# Patient Record
Sex: Female | Born: 1960
Health system: Southern US, Community
[De-identification: ages and names within clinical notes are randomized; demographics above are authoritative.]

## PROBLEM LIST (undated history)

## (undated) DIAGNOSIS — I471 Supraventricular tachycardia, unspecified: Secondary | ICD-10-CM

## (undated) DIAGNOSIS — F329 Major depressive disorder, single episode, unspecified: Secondary | ICD-10-CM

## (undated) DIAGNOSIS — R002 Palpitations: Secondary | ICD-10-CM

## (undated) DIAGNOSIS — F32A Depression, unspecified: Secondary | ICD-10-CM

## (undated) DIAGNOSIS — E782 Mixed hyperlipidemia: Secondary | ICD-10-CM

## (undated) DIAGNOSIS — R079 Chest pain, unspecified: Secondary | ICD-10-CM

## (undated) DIAGNOSIS — I499 Cardiac arrhythmia, unspecified: Secondary | ICD-10-CM

## (undated) DIAGNOSIS — B019 Varicella without complication: Secondary | ICD-10-CM

## (undated) DIAGNOSIS — R400 Somnolence: Secondary | ICD-10-CM

## (undated) DIAGNOSIS — G4730 Sleep apnea, unspecified: Secondary | ICD-10-CM

## (undated) DIAGNOSIS — E785 Hyperlipidemia, unspecified: Secondary | ICD-10-CM

## (undated) DIAGNOSIS — M722 Plantar fascial fibromatosis: Secondary | ICD-10-CM

## (undated) DIAGNOSIS — N83209 Unspecified ovarian cyst, unspecified side: Secondary | ICD-10-CM

## (undated) DIAGNOSIS — M1990 Unspecified osteoarthritis, unspecified site: Secondary | ICD-10-CM

## (undated) DIAGNOSIS — I4891 Unspecified atrial fibrillation: Secondary | ICD-10-CM

## (undated) HISTORY — DX: Unspecified ovarian cyst, unspecified side: N83.209

## (undated) HISTORY — DX: Varicella without complication: B01.9

## (undated) HISTORY — DX: Hyperlipidemia, unspecified: E78.5

## (undated) HISTORY — DX: Plantar fascial fibromatosis: M72.2

## (undated) HISTORY — DX: Somnolence: R40.0

## (undated) HISTORY — PX: TONSILLECTOMY: SUR1361

## (undated) HISTORY — DX: Chest pain, unspecified: R07.9

## (undated) HISTORY — DX: Cardiac arrhythmia, unspecified: I49.9

## (undated) HISTORY — DX: Palpitations: R00.2

## (undated) HISTORY — DX: Supraventricular tachycardia: I47.1

## (undated) HISTORY — DX: Supraventricular tachycardia, unspecified: I47.10

## (undated) HISTORY — PX: REPLACEMENT TOTAL KNEE BILATERAL: SUR1225

## (undated) HISTORY — DX: Mixed hyperlipidemia: E78.2

---

## 1978-03-03 HISTORY — PX: OVARIAN CYST SURGERY: SHX726

## 2005-03-03 HISTORY — PX: KNEE ARTHROSCOPY: SUR90

## 2009-03-29 ENCOUNTER — Ambulatory Visit: Payer: Self-pay | Admitting: Obstetrics and Gynecology

## 2009-05-14 ENCOUNTER — Ambulatory Visit: Payer: Self-pay | Admitting: Gastroenterology

## 2009-06-12 ENCOUNTER — Other Ambulatory Visit: Payer: Self-pay | Admitting: Internal Medicine

## 2009-10-16 ENCOUNTER — Other Ambulatory Visit: Payer: Self-pay | Admitting: Internal Medicine

## 2010-01-21 ENCOUNTER — Other Ambulatory Visit: Payer: Self-pay | Admitting: Internal Medicine

## 2010-04-16 ENCOUNTER — Ambulatory Visit: Payer: Self-pay | Admitting: Obstetrics and Gynecology

## 2010-04-19 ENCOUNTER — Ambulatory Visit: Payer: Self-pay | Admitting: Obstetrics and Gynecology

## 2010-04-23 ENCOUNTER — Ambulatory Visit: Payer: Self-pay | Admitting: General Practice

## 2010-05-06 ENCOUNTER — Inpatient Hospital Stay: Payer: Self-pay | Admitting: General Practice

## 2010-05-06 HISTORY — PX: BILATERAL KNEE ARTHROSCOPY: SUR91

## 2010-05-11 ENCOUNTER — Ambulatory Visit: Payer: Self-pay | Admitting: Anesthesiology

## 2010-05-29 ENCOUNTER — Encounter: Payer: Self-pay | Admitting: General Practice

## 2010-06-02 ENCOUNTER — Encounter: Payer: Self-pay | Admitting: General Practice

## 2010-07-02 ENCOUNTER — Encounter: Payer: Self-pay | Admitting: General Practice

## 2010-07-10 ENCOUNTER — Emergency Department: Payer: Self-pay | Admitting: General Practice

## 2010-07-11 ENCOUNTER — Emergency Department: Payer: Self-pay | Admitting: Emergency Medicine

## 2010-08-02 ENCOUNTER — Encounter: Payer: Self-pay | Admitting: General Practice

## 2010-08-21 ENCOUNTER — Other Ambulatory Visit: Payer: Self-pay | Admitting: Internal Medicine

## 2010-09-01 ENCOUNTER — Encounter: Payer: Self-pay | Admitting: General Practice

## 2010-10-02 ENCOUNTER — Encounter: Payer: Self-pay | Admitting: General Practice

## 2010-10-29 ENCOUNTER — Ambulatory Visit: Payer: Self-pay | Admitting: Obstetrics and Gynecology

## 2011-02-03 ENCOUNTER — Emergency Department: Payer: Self-pay | Admitting: Emergency Medicine

## 2011-03-21 ENCOUNTER — Other Ambulatory Visit: Payer: Self-pay | Admitting: Internal Medicine

## 2011-03-21 LAB — LIPID PANEL
HDL Cholesterol: 60 mg/dL (ref 40–60)
Ldl Cholesterol, Calc: 163 mg/dL — ABNORMAL HIGH (ref 0–100)

## 2011-05-15 ENCOUNTER — Ambulatory Visit: Payer: Self-pay

## 2011-06-02 ENCOUNTER — Ambulatory Visit: Payer: Self-pay | Admitting: Anesthesiology

## 2011-06-06 ENCOUNTER — Ambulatory Visit: Payer: Self-pay | Admitting: General Practice

## 2011-06-06 HISTORY — PX: TRIGGER FINGER RELEASE: SHX641

## 2011-06-27 ENCOUNTER — Other Ambulatory Visit: Payer: Self-pay | Admitting: Internal Medicine

## 2011-06-27 LAB — COMPREHENSIVE METABOLIC PANEL
Albumin: 3.8 g/dL (ref 3.4–5.0)
Anion Gap: 9 (ref 7–16)
Bilirubin,Total: 0.5 mg/dL (ref 0.2–1.0)
Co2: 25 mmol/L (ref 21–32)
Creatinine: 0.7 mg/dL (ref 0.60–1.30)
EGFR (Non-African Amer.): >60
Glucose: 103 mg/dL — ABNORMAL HIGH (ref 65–99)
Sodium: 142 mmol/L (ref 136–145)
Total Protein: 7.1 g/dL (ref 6.4–8.2)

## 2011-06-27 LAB — LIPID PANEL
Cholesterol: 170 mg/dL (ref 0–200)
Triglycerides: 74 mg/dL (ref 0–200)
VLDL Cholesterol, Calc: 15 mg/dL (ref 5–40)

## 2011-06-27 LAB — HEMOGLOBIN A1C: Hemoglobin A1C: 5.8 % (ref 4.2–6.3)

## 2011-07-02 ENCOUNTER — Ambulatory Visit: Payer: Self-pay | Admitting: Internal Medicine

## 2011-12-26 ENCOUNTER — Ambulatory Visit: Payer: Self-pay | Admitting: Family Medicine

## 2012-01-20 ENCOUNTER — Other Ambulatory Visit: Payer: Self-pay | Admitting: Internal Medicine

## 2012-01-20 LAB — COMPREHENSIVE METABOLIC PANEL
Albumin: 3.6 g/dL (ref 3.4–5.0)
Alkaline Phosphatase: 80 U/L (ref 50–136)
Anion Gap: 2 — ABNORMAL LOW (ref 7–16)
BUN: 18 mg/dL (ref 7–18)
Bilirubin,Total: 0.5 mg/dL (ref 0.2–1.0)
Calcium, Total: 9 mg/dL (ref 8.5–10.1)
Creatinine: 0.77 mg/dL (ref 0.60–1.30)
Osmolality: 281 (ref 275–301)
Potassium: 3.9 mmol/L (ref 3.5–5.1)
SGOT(AST): 24 U/L (ref 15–37)
SGPT (ALT): 27 U/L (ref 12–78)
Total Protein: 6.9 g/dL (ref 6.4–8.2)

## 2012-01-20 LAB — URINALYSIS, COMPLETE
Bacteria: NONE SEEN
Bilirubin,UR: NEGATIVE
Glucose,UR: NEGATIVE mg/dL (ref 0–75)
Leukocyte Esterase: NEGATIVE
Nitrite: NEGATIVE
Protein: NEGATIVE
RBC,UR: 2 /HPF (ref 0–5)
Squamous Epithelial: <1
WBC UR: 1 /HPF (ref 0–5)

## 2012-01-20 LAB — LIPID PANEL
HDL Cholesterol: 66 mg/dL — ABNORMAL HIGH (ref 40–60)
Ldl Cholesterol, Calc: 90 mg/dL (ref 0–100)
Triglycerides: 64 mg/dL (ref 0–200)
VLDL Cholesterol, Calc: 13 mg/dL (ref 5–40)

## 2012-01-20 LAB — CBC WITH DIFFERENTIAL/PLATELET
Basophil #: 0 10*3/uL (ref 0.0–0.1)
Eosinophil #: 0.1 10*3/uL (ref 0.0–0.7)
HCT: 37.8 % (ref 35.0–47.0)
Lymphocyte #: 1.5 10*3/uL (ref 1.0–3.6)
Lymphocyte %: 40 %
MCHC: 33.6 g/dL (ref 32.0–36.0)
Monocyte #: 0.6 x10 3/mm (ref 0.2–0.9)
Neutrophil #: 1.5 10*3/uL (ref 1.4–6.5)
Neutrophil %: 41.2 %
Platelet: 205 10*3/uL (ref 150–440)
RBC: 4 10*6/uL (ref 3.80–5.20)
RDW: 13.3 % (ref 11.5–14.5)
WBC: 3.7 10*3/uL (ref 3.6–11.0)

## 2012-01-20 LAB — HEMOGLOBIN A1C: Hemoglobin A1C: 5.7 % (ref 4.2–6.3)

## 2012-06-16 ENCOUNTER — Ambulatory Visit: Payer: Self-pay | Admitting: Obstetrics and Gynecology

## 2012-08-17 ENCOUNTER — Ambulatory Visit: Payer: Self-pay | Admitting: Internal Medicine

## 2012-08-17 LAB — LIPID PANEL
Cholesterol: 173 mg/dL (ref 0–200)
HDL Cholesterol: 61 mg/dL — ABNORMAL HIGH (ref 40–60)
Triglycerides: 83 mg/dL (ref 0–200)
VLDL Cholesterol, Calc: 17 mg/dL (ref 5–40)

## 2012-08-17 LAB — CBC WITH DIFFERENTIAL/PLATELET
Basophil #: 0.1 10*3/uL (ref 0.0–0.1)
Basophil %: 1.2 %
Eosinophil #: 0.2 10*3/uL (ref 0.0–0.7)
Eosinophil %: 3.5 %
MCH: 32 pg (ref 26.0–34.0)
MCV: 94 fL (ref 80–100)
Monocyte #: 0.8 x10 3/mm (ref 0.2–0.9)
Neutrophil %: 49.3 %
RBC: 4.07 10*6/uL (ref 3.80–5.20)
RDW: 12.9 % (ref 11.5–14.5)
WBC: 4.6 10*3/uL (ref 3.6–11.0)

## 2012-08-17 LAB — COMPREHENSIVE METABOLIC PANEL
Calcium, Total: 9.3 mg/dL (ref 8.5–10.1)
Co2: 27 mmol/L (ref 21–32)
EGFR (African American): >60
EGFR (Non-African Amer.): >60
Osmolality: 280 (ref 275–301)
SGPT (ALT): 32 U/L (ref 12–78)
Sodium: 140 mmol/L (ref 136–145)
Total Protein: 6.8 g/dL (ref 6.4–8.2)

## 2012-08-17 LAB — TSH: Thyroid Stimulating Horm: 2.43 u[IU]/mL

## 2012-08-26 ENCOUNTER — Ambulatory Visit: Payer: Self-pay | Admitting: Internal Medicine

## 2012-08-31 ENCOUNTER — Ambulatory Visit: Payer: Self-pay | Admitting: Internal Medicine

## 2013-09-23 ENCOUNTER — Ambulatory Visit: Payer: Self-pay | Admitting: Obstetrics and Gynecology

## 2013-11-28 ENCOUNTER — Other Ambulatory Visit: Payer: Self-pay | Admitting: Internal Medicine

## 2013-11-28 LAB — URINALYSIS, COMPLETE
BACTERIA: NONE SEEN
BILIRUBIN, UR: NEGATIVE
Glucose,UR: NEGATIVE mg/dL (ref 0–75)
Ketone: NEGATIVE
LEUKOCYTE ESTERASE: NEGATIVE
NITRITE: NEGATIVE
Ph: 5 (ref 4.5–8.0)
Protein: NEGATIVE
Specific Gravity: 1.023 (ref 1.003–1.030)
WBC UR: <1 /HPF (ref 0–5)

## 2013-11-28 LAB — COMPREHENSIVE METABOLIC PANEL
ALBUMIN: 3.7 g/dL (ref 3.4–5.0)
Alkaline Phosphatase: 86 U/L
Anion Gap: 6 — ABNORMAL LOW (ref 7–16)
BILIRUBIN TOTAL: 0.4 mg/dL (ref 0.2–1.0)
BUN: 23 mg/dL — ABNORMAL HIGH (ref 7–18)
CHLORIDE: 105 mmol/L (ref 98–107)
Calcium, Total: 8.6 mg/dL (ref 8.5–10.1)
Co2: 28 mmol/L (ref 21–32)
Creatinine: 0.84 mg/dL (ref 0.60–1.30)
EGFR (African American): >60
EGFR (Non-African Amer.): >60
GLUCOSE: 104 mg/dL — AB (ref 65–99)
Osmolality: 282 (ref 275–301)
POTASSIUM: 4 mmol/L (ref 3.5–5.1)
SGOT(AST): 26 U/L (ref 15–37)
SGPT (ALT): 37 U/L
Sodium: 139 mmol/L (ref 136–145)
Total Protein: 7 g/dL (ref 6.4–8.2)

## 2013-11-28 LAB — MAGNESIUM: MAGNESIUM: 1.6 mg/dL — AB

## 2013-11-28 LAB — CBC WITH DIFFERENTIAL/PLATELET
Basophil #: 0 10*3/uL (ref 0.0–0.1)
Basophil %: 0.7 %
EOS ABS: 0.2 10*3/uL (ref 0.0–0.7)
EOS PCT: 3.1 %
HCT: 41 % (ref 35.0–47.0)
HGB: 13.1 g/dL (ref 12.0–16.0)
Lymphocyte #: 2.3 10*3/uL (ref 1.0–3.6)
Lymphocyte %: 34.1 %
MCH: 30.9 pg (ref 26.0–34.0)
MCHC: 31.9 g/dL — AB (ref 32.0–36.0)
MCV: 97 fL (ref 80–100)
MONO ABS: 0.9 x10 3/mm (ref 0.2–0.9)
Monocyte %: 13.2 %
NEUTROS ABS: 3.2 10*3/uL (ref 1.4–6.5)
NEUTROS PCT: 48.9 %
Platelet: 221 10*3/uL (ref 150–440)
RBC: 4.22 10*6/uL (ref 3.80–5.20)
RDW: 12.9 % (ref 11.5–14.5)
WBC: 6.6 10*3/uL (ref 3.6–11.0)

## 2013-11-28 LAB — LIPID PANEL
CHOLESTEROL: 196 mg/dL (ref 0–200)
HDL: 57 mg/dL (ref 40–60)
Ldl Cholesterol, Calc: 97 mg/dL (ref 0–100)
TRIGLYCERIDES: 208 mg/dL — AB (ref 0–200)
VLDL Cholesterol, Calc: 42 mg/dL — ABNORMAL HIGH (ref 5–40)

## 2013-11-28 LAB — TSH: Thyroid Stimulating Horm: 3.39 u[IU]/mL

## 2014-01-18 ENCOUNTER — Telehealth: Payer: Self-pay | Admitting: Internal Medicine

## 2014-01-18 NOTE — Telephone Encounter (Signed)
Letterhead request was faxed to Dr. Ubaldo Glassing at [facility] 11.18.15 at 8:08 to obtain records for appointment with Dr Taylor:djc Received 16 pages: given to Claiborne Billings L for Dr. Lovena Le

## 2014-01-19 ENCOUNTER — Ambulatory Visit (INDEPENDENT_AMBULATORY_CARE_PROVIDER_SITE_OTHER): Payer: 59 | Admitting: Internal Medicine

## 2014-01-19 ENCOUNTER — Encounter: Payer: Self-pay | Admitting: Internal Medicine

## 2014-01-19 VITALS — BP 140/76 | HR 55 | Ht 66.0 in | Wt 236.2 lb

## 2014-01-19 DIAGNOSIS — I480 Paroxysmal atrial fibrillation: Secondary | ICD-10-CM

## 2014-01-19 DIAGNOSIS — R002 Palpitations: Secondary | ICD-10-CM

## 2014-01-19 DIAGNOSIS — I4891 Unspecified atrial fibrillation: Secondary | ICD-10-CM | POA: Insufficient documentation

## 2014-01-19 DIAGNOSIS — E785 Hyperlipidemia, unspecified: Secondary | ICD-10-CM

## 2014-01-19 DIAGNOSIS — G4710 Hypersomnia, unspecified: Secondary | ICD-10-CM

## 2014-01-19 DIAGNOSIS — R400 Somnolence: Secondary | ICD-10-CM | POA: Insufficient documentation

## 2014-01-19 MED ORDER — ASPIRIN EC 81 MG PO TBEC: 81.0000 mg | DELAYED_RELEASE_TABLET | Freq: Every day | ORAL | Status: DC

## 2014-01-19 MED ORDER — FLECAINIDE ACETATE 100 MG PO TABS: 50.0000 mg | ORAL_TABLET | Freq: Two times a day (BID) | ORAL | Status: DC

## 2014-01-19 NOTE — Assessment & Plan Note (Signed)
While the patient may have had SVT in the past, she clearly has atrial fibrillation with a rapid ventricular response now, despite therapy with beta blockers. We have discussed the treatment options in detail. First, her stroke risk is low, and I recommended that she only take aspirin. Secondly is symptom control. She is quite symptomatic in atrial fibrillation. I have recommended that the patient be initiated on flecainide 50 mg twice daily. Up titration of her medications will be made based on how she responds to her initial low dose. Her left atrial dimension is normal, or nearly so.

## 2014-01-19 NOTE — Assessment & Plan Note (Signed)
The patient states that she has been evaluated, and does not have sleep apnea.

## 2014-01-19 NOTE — Assessment & Plan Note (Signed)
She will continue statin therapy with Crestor. She is encouraged to lose weight.

## 2014-01-19 NOTE — Progress Notes (Signed)
HPI [name] is referred today by Dr. Ubaldo Glassing for evaluation of tachycardia palpitations and documented atrial fibrillation. She is a very pleasant 53 year old woman who works at [employer]. The patient has had palpitations for many years, initially during pregnancy. At that time and subsequently, her episodes would start and stop suddenly, could be terminated with Valsalva maneuvers, and typically last only 10 or 15 minutes. Approximately 6 months ago, she began to experience episodes which would last for many hours, associated with shortness of breath and chest pressure, and she had documented episodes of tachycardia on a rhythm strip which I have been able to review. These episodes demonstrate atrial fibrillation with a rapid ventricular response. The patient has had multiple evaluations including a stress test which was negative and a 2-D echo which demonstrated normal left ventricular systolic function. Review of her echo demonstrates a normal sized left atrium. In atrial fibrillation, her ventricular rates are over 150 bpm. These episodes typically last for up to 4 hours, and/or occurring 2 or 3 times a month. Allergies  Allergen Reactions  . Ivp Dye [Iodinated Diagnostic Agents]     ALSO VICRYL CAUSES OPEN SORES/BLISTERS  . Pneumococcal Vac Polyvalent Other (See Comments)    Fever, edema     Current Outpatient Prescriptions  Medication Sig Dispense Refill  . Cholecalciferol (VITAMIN D3) 2000 UNITS TABS Take 1 tablet by mouth everyday except take 2 tablets by mouth on Sunday    . clotrimazole-betamethasone (LOTRISONE) cream APPLY A SMALL AMOUNT TO AFFECTED AREA 2 TIMES A DAY AS DIRECTED    . magnesium oxide (MAG-OX) 400 MG tablet Take 400 mg by mouth daily.     . metoprolol succinate (TOPROL-XL) 50 MG 24 hr tablet TAKE ONE TABLET BY MOUTH IN THE MORNING    . metoprolol tartrate (LOPRESSOR) 25 MG tablet Take 1 tablet by mouth daily as needed (arrythmias).     .  rosuvastatin (CRESTOR) 10 MG tablet TAKE ONE TABLET BY MOUTH IN THE MORNING    . venlafaxine XR (EFFEXOR-XR) 150 MG 24 hr capsule Take 1 capsule by mouth every morning.  3  . aspirin EC 81 MG tablet Take 1 tablet (81 mg total) by mouth daily. 90 tablet 3  . flecainide (TAMBOCOR) 100 MG tablet Take 0.5 tablets (50 mg total) by mouth 2 (two) times daily. 90 tablet 3   No current facility-administered medications for this visit.     Past Medical History  Diagnosis Date  . PSVT (paroxysmal supraventricular tachycardia)   . Mixed hyperlipidemia   . Daytime somnolence   . Palpitations   . Arrhythmia   . Chickenpox   . Hyperlipemia   . Ovarian cyst   . Plantar fasciitis of left foot   . Chest pain     NM Myocardial Scan 08/31/12     ROS:   All systems reviewed and negative except as noted in the HPI.   Past Surgical History  Procedure Laterality Date  . Knee arthroscopy Right 2007  . Bilateral knee arthroscopy  05/06/10  . Trigger finger release Right 06/06/11    Right ring finger  . Ovarian cyst surgery  1980     Family History  Problem Relation Age of Onset  . Hypertension Mother   . Diabetes type II Mother   . Asthma Mother   . Mental illness Mother   . Heart attack Father   . Hypertension Sister   . Diabetes type II Sister   .  Diabetes type II Other   . Cancer Other   . CAD      Fam Hx of CAD     History   Social History  . Marital Status: Married    Spouse Name: N/A    Number of Children: N/A  . Years of Education: N/A   Occupational History  . Not on file.   Social History Main Topics  . Smoking status: Former Smoker    Types: Cigarettes    Quit date: 03/04/1979  . Smokeless tobacco: Never Used  . Alcohol Use: No  . Drug Use: Not on file  . Sexual Activity: Not on file   Other Topics Concern  . Not on file   Social History Narrative     BP 140/76 mmHg  Pulse 55  Ht 5\' 6"  (1.676 m)  Wt 236 lb 3.2 oz (107.14 kg)  BMI 38.14 kg/m2  Physical  Exam:  Well appearing obese, middle-aged woman, NAD HEENT: Unremarkable Neck:  7 cm JVD, no thyromegally Back:  No CVA tenderness Lungs:  Clear with no wheezes, rales, or rhonchi. HEART:  Regular rate rhythm, no murmurs, no rubs, no clicks Abd:  soft, positive bowel sounds, no organomegally, no rebound, no guarding Ext:  2 plus pulses, no edema, no cyanosis, no clubbing Skin:  No rashes no nodules Neuro:  CN II through XII intact, motor grossly intact  EKG - sinus bradycardia  Telemetry tracings - atrial fibrillation with a rapid ventricular response  Assess/Plan:

## 2014-01-19 NOTE — Patient Instructions (Signed)
Your physician has recommended you make the following change in your medication:  1) Start Flecainide 100mg  ----take 1/2 tablet twice daily   Call Janan Halter, RN in 3 weeks [phone]

## 2014-02-22 ENCOUNTER — Ambulatory Visit: Payer: Self-pay | Admitting: Internal Medicine

## 2014-04-19 ENCOUNTER — Ambulatory Visit (INDEPENDENT_AMBULATORY_CARE_PROVIDER_SITE_OTHER): Payer: 59 | Admitting: Internal Medicine

## 2014-04-19 ENCOUNTER — Encounter: Payer: Self-pay | Admitting: Internal Medicine

## 2014-04-19 VITALS — BP 120/84 | HR 91 | Ht 66.0 in | Wt 239.0 lb

## 2014-04-19 DIAGNOSIS — I480 Paroxysmal atrial fibrillation: Secondary | ICD-10-CM

## 2014-04-19 DIAGNOSIS — R400 Somnolence: Secondary | ICD-10-CM

## 2014-04-19 DIAGNOSIS — G4710 Hypersomnia, unspecified: Secondary | ICD-10-CM

## 2014-04-19 DIAGNOSIS — E785 Hyperlipidemia, unspecified: Secondary | ICD-10-CM

## 2014-04-19 NOTE — Assessment & Plan Note (Signed)
Her body habitus would suggest that she has sleep apnea. However she has undergone sleep study and tells me that she does not have evidence of sleep apnea. I've encouraged the patient to lose weight.

## 2014-04-19 NOTE — Patient Instructions (Signed)
Your physician recommends that you continue on your current medications as directed. Please refer to the Current Medication list given to you today.  Your physician wants you to follow-up in: 6 months with Dr. Taylor.  You will receive a reminder letter in the mail two months in advance. If you don't receive a letter, please call our office to schedule the follow-up appointment.  

## 2014-04-19 NOTE — Assessment & Plan Note (Signed)
We have discussed the treatment options in detail. I've encouraged the patient not to miss additional doses of flecainide. We discussed the possibility of up titration of her flecainide. She will undergo watchful waiting. If she has increasingly frequent episodes of atrial fibrillation, she can take a second or third dose of flecainide up to a total of 100 mg twice daily. I'll see her back in 6 months.

## 2014-04-19 NOTE — Progress Notes (Signed)
HPI Brianna French returns today for follow-up. She is a very pleasant 54 year old woman with a history of palpitations and paroxysmal atrial arrhythmias. She has had up to 4 hours of atrial fibrillation in the past. When I saw the patient several months ago, we recommended initiation of low-dose flecainide. Her symptoms are much improved. She admits taking her flecainide every morning, but does occasionally miss her evening dose. Despite this, her symptoms have been very well controlled with only a few breakthrough episodes. She denies syncope or near-syncope. No peripheral edema. She does note bizarre dreams and nightmares. Allergies  Allergen Reactions  . Ivp Dye [Iodinated Diagnostic Agents]     ALSO VICRYL CAUSES OPEN SORES/BLISTERS  . Other Other (See Comments)    Vicryl suture causes sores/blisters  . Pneumococcal Vac Polyvalent Other (See Comments)    Fever, edema     Current Outpatient Prescriptions  Medication Sig Dispense Refill  . aspirin EC 81 MG tablet Take 1 tablet (81 mg total) by mouth daily. 90 tablet 3  . Cholecalciferol (VITAMIN D3) 2000 UNITS TABS Take 1 tablet by mouth everyday except take 2 tablets by mouth on Sunday    . clotrimazole-betamethasone (LOTRISONE) cream APPLY A SMALL AMOUNT TO AFFECTED AREA 2 TIMES A DAY AS DIRECTED    . flecainide (TAMBOCOR) 100 MG tablet Take 0.5 tablets (50 mg total) by mouth 2 (two) times daily. 90 tablet 3  . magnesium oxide (MAG-OX) 400 MG tablet Take 400 mg by mouth daily.     . rosuvastatin (CRESTOR) 10 MG tablet TAKE ONE TABLET BY MOUTH IN THE MORNING    . venlafaxine XR (EFFEXOR-XR) 150 MG 24 hr capsule Take 1 capsule by mouth every morning.  3   No current facility-administered medications for this visit.     Past Medical History  Diagnosis Date  . PSVT (paroxysmal supraventricular tachycardia)   . Mixed hyperlipidemia   . Daytime somnolence   . Palpitations   . Arrhythmia   . Chickenpox   . Hyperlipemia   .  Ovarian cyst   . Plantar fasciitis of left foot   . Chest pain     NM Myocardial Scan 08/31/12     ROS:   All systems reviewed and negative except as noted in the HPI.   Past Surgical History  Procedure Laterality Date  . Knee arthroscopy Right 2007  . Bilateral knee arthroscopy  05/06/10  . Trigger finger release Right 06/06/11    Right ring finger  . Ovarian cyst surgery  1980     Family History  Problem Relation Age of Onset  . Hypertension Mother   . Diabetes type II Mother   . Asthma Mother   . Mental illness Mother   . Heart attack Father   . Hypertension Sister   . Diabetes type II Sister   . Diabetes type II Other   . Cancer Other   . CAD      Fam Hx of CAD     History   Social History  . Marital Status: Married    Spouse Name: N/A  . Number of Children: N/A  . Years of Education: N/A   Occupational History  . Not on file.   Social History Main Topics  . Smoking status: Former Smoker    Types: Cigarettes    Quit date: 03/04/1979  . Smokeless tobacco: Never Used  . Alcohol Use: No  . Drug Use: Not on file  . Sexual Activity:  Not on file   Other Topics Concern  . Not on file   Social History Narrative     BP 120/84 mmHg  Pulse 91  Ht 5\' 6"  (1.676 m)  Wt 239 lb (108.41 kg)  BMI 38.59 kg/m2  Physical Exam:  Well appearing obese, middle-aged woman, NAD HEENT: Unremarkable Neck:  No JVD, no thyromegally Lymphatics:  No adenopathy Back:  No CVA tenderness Lungs:  Clear with no wheezes, rales, or rhonchi. HEART:  Regular rate rhythm, no murmurs, no rubs, no clicks Abd:  soft, positive bowel sounds, no organomegally, no rebound, no guarding Ext:  2 plus pulses, no edema, no cyanosis, no clubbing Skin:  No rashes no nodules Neuro:  CN II through XII intact, motor grossly intact  EKG Normal sinus rhythm.  Assess/Plan:

## 2014-04-19 NOTE — Assessment & Plan Note (Signed)
She has been trying to lose weight. I've encouraged the patient to consider Weight Watchers.

## 2014-06-25 NOTE — Op Note (Signed)
PATIENT NAME:  Brianna French, RAZAVI MR#:  [id number] DATE OF BIRTH:  08/10/1960  DATE OF PROCEDURE:  06/06/2011  PREOPERATIVE DIAGNOSES:  1. Stenosing tenosynovitis of the right ring finger.  2. Tenosynovitis involving the left thumb.   POSTOPERATIVE DIAGNOSES: 1. Stenosing tenosynovitis of the right ring finger.  2. Tenosynovitis involving the left thumb.   PROCEDURES PERFORMED:  1. Right ring trigger finger release.  2. Corticosteroid injection to the base of the left thumb.   SURGEON: Laurice Record. Hooten, MD  ANESTHESIA: General.   ESTIMATED BLOOD LOSS: Minimal.   TOURNIQUET TIME: 12 minutes.   DRAINS: None.   ESTIMATED BLOOD LOSS: Minimal.   INDICATIONS FOR SURGERY: The patient is a 54 year old female who has been seen for complaints of triggering and locking of the right ring finger with associated tenderness in the area of the A1 pulley. She has not seen any significant improvement despite conservative nonsurgical intervention. The patient also complained of pain at the base of the left thumb which she attributed to overuse while protecting the contralateral hand. After discussion of the risks and benefits of surgical intervention, the patient expressed her understanding of the risks and benefits and agreed with plans for surgical intervention.   PROCEDURE IN DETAIL: The patient was brought into the operating room and, after adequate general anesthesia was achieved, a tourniquet was placed on the patient's upper right arm. The patient's right hand and arm were cleaned and prepped with alcohol and DuraPrep and draped in the usual sterile fashion. A time out was performed as per usual protocol. The right upper extremity was exsanguinated using an Esmarch, and the tourniquet was inflated to 250 mmHg. Loupe magnification was used throughout the procedure. A transverse incision was made proximal to the A-1 pulley of the right ring finger. Blunt dissection was carried down to the tendon  sheath and the leading edge of the A1 pulley was identified. The A-1 pulley was incised using tenotomy scissors. Complete release of the A1 pulley was appreciated. A moderate amount of tenosynovial fluid was noted. The wound was irrigated with antibiotic solution. The right ring finger was placed through range of motion with smooth excursion of the flexor tendon. Skin edges were reapproximated using #5-0 nylon. The site was injected with 0.25% Marcaine. A sterile dressing was applied. The tourniquet was deflated after total tourniquet time of 12 minutes.  Next, the base of the left thumb was cleaned and prepped with Betadine. A combination of 0.25% Marcaine (3 mL) was mixed with 1 mL of Kenalog. A total of 2 mL of the solution was injected along the base of the left thumb. A Band-Aid was applied to the site.   The patient tolerated the procedure well. She was transported to the recovery room in stable condition.  ___________________________ Laurice Record. Holley Bouche., MD jph:slb D: 06/06/2011 18:16:58 ET T: 06/07/2011 11:12:05 ET JOB#: [id number]  cc: Jeneen Rinks P. Holley Bouche., MD, <Dictator> JAMES P Holley Bouche MD ELECTRONICALLY SIGNED 06/08/2011 20:29

## 2014-07-16 ENCOUNTER — Emergency Department: Payer: 59

## 2014-07-16 ENCOUNTER — Emergency Department: Payer: 59 | Admitting: Anesthesiology

## 2014-07-16 ENCOUNTER — Encounter
Admission: EM | Disposition: A | Discharge status: Home / Self Care | Payer: Self-pay | Source: Home / Self Care | Attending: Emergency Medicine

## 2014-07-16 ENCOUNTER — Encounter: Payer: Self-pay | Admitting: *Deleted

## 2014-07-16 ENCOUNTER — Observation Stay
Admission: EM | Admit: 2014-07-16 | Discharge: 2014-07-17 | Disposition: A | Discharge status: Home / Self Care | Payer: 59 | Attending: Surgery | Admitting: Surgery

## 2014-07-16 ENCOUNTER — Inpatient Hospital Stay: Admit: 2014-07-16 | Payer: Self-pay | Admitting: Surgery

## 2014-07-16 DIAGNOSIS — E782 Mixed hyperlipidemia: Secondary | ICD-10-CM | POA: Insufficient documentation

## 2014-07-16 DIAGNOSIS — Z87891 Personal history of nicotine dependence: Secondary | ICD-10-CM | POA: Insufficient documentation

## 2014-07-16 DIAGNOSIS — I471 Supraventricular tachycardia: Secondary | ICD-10-CM | POA: Diagnosis not present

## 2014-07-16 DIAGNOSIS — Z809 Family history of malignant neoplasm, unspecified: Secondary | ICD-10-CM | POA: Insufficient documentation

## 2014-07-16 DIAGNOSIS — Z79899 Other long term (current) drug therapy: Secondary | ICD-10-CM | POA: Insufficient documentation

## 2014-07-16 DIAGNOSIS — K353 Acute appendicitis with localized peritonitis, without perforation or gangrene: Secondary | ICD-10-CM

## 2014-07-16 DIAGNOSIS — R400 Somnolence: Secondary | ICD-10-CM | POA: Diagnosis not present

## 2014-07-16 DIAGNOSIS — Z8249 Family history of ischemic heart disease and other diseases of the circulatory system: Secondary | ICD-10-CM | POA: Diagnosis not present

## 2014-07-16 DIAGNOSIS — Z887 Allergy status to serum and vaccine status: Secondary | ICD-10-CM | POA: Diagnosis not present

## 2014-07-16 DIAGNOSIS — E278 Other specified disorders of adrenal gland: Secondary | ICD-10-CM | POA: Diagnosis present

## 2014-07-16 DIAGNOSIS — K3580 Unspecified acute appendicitis: Principal | ICD-10-CM | POA: Insufficient documentation

## 2014-07-16 DIAGNOSIS — Z833 Family history of diabetes mellitus: Secondary | ICD-10-CM | POA: Insufficient documentation

## 2014-07-16 DIAGNOSIS — Z91041 Radiographic dye allergy status: Secondary | ICD-10-CM | POA: Insufficient documentation

## 2014-07-16 DIAGNOSIS — E785 Hyperlipidemia, unspecified: Secondary | ICD-10-CM | POA: Insufficient documentation

## 2014-07-16 DIAGNOSIS — I4891 Unspecified atrial fibrillation: Secondary | ICD-10-CM | POA: Diagnosis not present

## 2014-07-16 DIAGNOSIS — Z825 Family history of asthma and other chronic lower respiratory diseases: Secondary | ICD-10-CM | POA: Diagnosis not present

## 2014-07-16 DIAGNOSIS — K37 Unspecified appendicitis: Secondary | ICD-10-CM | POA: Diagnosis present

## 2014-07-16 DIAGNOSIS — E279 Disorder of adrenal gland, unspecified: Secondary | ICD-10-CM | POA: Insufficient documentation

## 2014-07-16 HISTORY — PX: LAPAROSCOPIC APPENDECTOMY: SHX408

## 2014-07-16 HISTORY — DX: Unspecified atrial fibrillation: I48.91

## 2014-07-16 HISTORY — PX: APPENDECTOMY: SHX54

## 2014-07-16 LAB — URINALYSIS COMPLETE WITH MICROSCOPIC (ARMC ONLY)
Bacteria, UA: NONE SEEN
Bilirubin Urine: NEGATIVE
Glucose, UA: NEGATIVE mg/dL
Ketones, ur: NEGATIVE mg/dL
Leukocytes, UA: NEGATIVE
Nitrite: NEGATIVE
PH: 7 (ref 5.0–8.0)
Protein, ur: NEGATIVE mg/dL
Specific Gravity, Urine: 1.006 (ref 1.005–1.030)

## 2014-07-16 LAB — CBC WITH DIFFERENTIAL/PLATELET
BASOS ABS: 0 10*3/uL (ref 0–0.1)
Basophils Relative: 0 %
Eosinophils Absolute: 0 10*3/uL (ref 0–0.7)
Eosinophils Relative: 0 %
HCT: 43.2 % (ref 35.0–47.0)
Hemoglobin: 14.4 g/dL (ref 12.0–16.0)
LYMPHS ABS: 1 10*3/uL (ref 1.0–3.6)
Lymphocytes Relative: 7 %
MCH: 31.4 pg (ref 26.0–34.0)
MCHC: 33.2 g/dL (ref 32.0–36.0)
MCV: 94.5 fL (ref 80.0–100.0)
MONOS PCT: 11 %
Monocytes Absolute: 1.6 10*3/uL — ABNORMAL HIGH (ref 0.2–0.9)
NEUTROS ABS: 11.9 10*3/uL — AB (ref 1.4–6.5)
Neutrophils Relative %: 82 %
PLATELETS: 205 10*3/uL (ref 150–440)
RBC: 4.57 MIL/uL (ref 3.80–5.20)
RDW: 12.8 % (ref 11.5–14.5)
WBC: 14.5 10*3/uL — ABNORMAL HIGH (ref 3.6–11.0)

## 2014-07-16 LAB — COMPREHENSIVE METABOLIC PANEL
ALT: 26 U/L (ref 14–54)
ANION GAP: 10 (ref 5–15)
AST: 25 U/L (ref 15–41)
Albumin: 3.9 g/dL (ref 3.5–5.0)
Alkaline Phosphatase: 85 U/L (ref 38–126)
BUN: 14 mg/dL (ref 6–20)
CALCIUM: 9.1 mg/dL (ref 8.9–10.3)
CHLORIDE: 104 mmol/L (ref 101–111)
CO2: 23 mmol/L (ref 22–32)
Creatinine, Ser: 0.57 mg/dL (ref 0.44–1.00)
GFR calc Af Amer: >60 mL/min (ref >60)
Glucose, Bld: 129 mg/dL — ABNORMAL HIGH (ref 65–99)
Potassium: 3.8 mmol/L (ref 3.5–5.1)
SODIUM: 137 mmol/L (ref 135–145)
TOTAL PROTEIN: 7.1 g/dL (ref 6.5–8.1)
Total Bilirubin: 0.4 mg/dL (ref 0.3–1.2)

## 2014-07-16 LAB — LIPASE, BLOOD: Lipase: 29 U/L (ref 22–51)

## 2014-07-16 SURGERY — APPENDECTOMY, LAPAROSCOPIC
Anesthesia: General

## 2014-07-16 MED ORDER — GLYCOPYRROLATE 0.2 MG/ML IJ SOLN
INTRAMUSCULAR | Status: DC | PRN
  Administered 2014-07-16: 0.6 mg via INTRAVENOUS

## 2014-07-16 MED ORDER — ROCURONIUM BROMIDE 100 MG/10ML IV SOLN
INTRAVENOUS | Status: DC | PRN
  Administered 2014-07-16: 5 mg via INTRAVENOUS

## 2014-07-16 MED ORDER — PHENYLEPHRINE HCL 10 MG/ML IJ SOLN
INTRAMUSCULAR | Status: DC | PRN
  Administered 2014-07-16: 100 ug via INTRAVENOUS
  Administered 2014-07-16: 200 ug via INTRAVENOUS
  Administered 2014-07-16: 100 ug via INTRAVENOUS
  Administered 2014-07-16: 200 ug via INTRAVENOUS

## 2014-07-16 MED ORDER — ACETAMINOPHEN 325 MG PO TABS: 650.0000 mg | ORAL_TABLET | Freq: Four times a day (QID) | ORAL | Status: DC | PRN

## 2014-07-16 MED ORDER — PROMETHAZINE HCL 25 MG/ML IJ SOLN
25.0000 mg | Freq: Once | INTRAMUSCULAR | Status: AC
  Administered 2014-07-16: 25 mg via INTRAVENOUS

## 2014-07-16 MED ORDER — FENTANYL CITRATE (PF) 100 MCG/2ML IJ SOLN
25.0000 ug | INTRAMUSCULAR | Status: DC | PRN
  Administered 2014-07-16 (×4): 25 ug via INTRAVENOUS

## 2014-07-16 MED ORDER — FAMOTIDINE 20 MG PO TABS
20.0000 mg | ORAL_TABLET | Freq: Every day | ORAL | Status: DC
  Administered 2014-07-17: 20 mg via ORAL
  Filled 2014-07-16: qty 1

## 2014-07-16 MED ORDER — SUCCINYLCHOLINE CHLORIDE 20 MG/ML IJ SOLN
INTRAMUSCULAR | Status: DC | PRN
  Administered 2014-07-16: 80 mg via INTRAVENOUS

## 2014-07-16 MED ORDER — DEXTROSE 5 % IV SOLN
1.0000 g | INTRAVENOUS | Status: AC
  Administered 2014-07-16: 1 g via INTRAVENOUS
  Filled 2014-07-16: qty 1

## 2014-07-16 MED ORDER — FENTANYL CITRATE (PF) 100 MCG/2ML IJ SOLN
INTRAMUSCULAR | Status: DC | PRN
  Administered 2014-07-16: 100 ug via INTRAVENOUS

## 2014-07-16 MED ORDER — FENTANYL CITRATE (PF) 100 MCG/2ML IJ SOLN
INTRAMUSCULAR | Status: AC
  Administered 2014-07-16: 25 ug via INTRAVENOUS
  Filled 2014-07-16: qty 2

## 2014-07-16 MED ORDER — FENTANYL CITRATE (PF) 100 MCG/2ML IJ SOLN
50.0000 ug | Freq: Once | INTRAMUSCULAR | Status: AC
  Administered 2014-07-16: 50 ug via INTRAVENOUS

## 2014-07-16 MED ORDER — BUPIVACAINE HCL (PF) 0.25 % IJ SOLN
INTRAMUSCULAR | Status: AC
  Filled 2014-07-16: qty 30

## 2014-07-16 MED ORDER — ONDANSETRON HCL 4 MG/2ML IJ SOLN: 4.0000 mg | Freq: Once | INTRAMUSCULAR | Status: DC | PRN

## 2014-07-16 MED ORDER — NEOSTIGMINE METHYLSULFATE 10 MG/10ML IV SOLN
INTRAVENOUS | Status: DC | PRN
  Administered 2014-07-16: 3 mg via INTRAVENOUS

## 2014-07-16 MED ORDER — SODIUM CHLORIDE 0.9 % IV BOLUS (SEPSIS)
1000.0000 mL | Freq: Once | INTRAVENOUS | Status: AC
  Administered 2014-07-16: 1000 mL via INTRAVENOUS

## 2014-07-16 MED ORDER — BARIUM SULFATE 2.1 % PO SUSP
450.0000 mL | ORAL | Status: AC
  Administered 2014-07-16 (×2): 450 mL via ORAL

## 2014-07-16 MED ORDER — MIDAZOLAM HCL 2 MG/2ML IJ SOLN
INTRAMUSCULAR | Status: DC | PRN
  Administered 2014-07-16: 2 mg via INTRAVENOUS

## 2014-07-16 MED ORDER — DIPHENHYDRAMINE HCL 50 MG/ML IJ SOLN
50.0000 mg | Freq: Once | INTRAMUSCULAR | Status: AC
  Administered 2014-07-16: 50 mg via INTRAVENOUS

## 2014-07-16 MED ORDER — DIPHENHYDRAMINE HCL 50 MG/ML IJ SOLN
INTRAMUSCULAR | Status: AC
  Administered 2014-07-16: 50 mg via INTRAVENOUS
  Filled 2014-07-16: qty 1

## 2014-07-16 MED ORDER — FENTANYL CITRATE (PF) 100 MCG/2ML IJ SOLN
25.0000 ug | Freq: Once | INTRAMUSCULAR | Status: AC
  Administered 2014-07-16: 25 ug via INTRAVENOUS

## 2014-07-16 MED ORDER — HYDROCODONE-ACETAMINOPHEN 5-325 MG PO TABS: 1.0000 | ORAL_TABLET | ORAL | Status: DC | PRN

## 2014-07-16 MED ORDER — IOHEXOL 300 MG/ML  SOLN
100.0000 mL | Freq: Once | INTRAMUSCULAR | Status: AC | PRN
Start: 2014-07-16 — End: 2014-07-16
  Administered 2014-07-16: 100 mL via INTRAVENOUS

## 2014-07-16 MED ORDER — PROPOFOL 10 MG/ML IV BOLUS
INTRAVENOUS | Status: DC | PRN
  Administered 2014-07-16: 170 mg via INTRAVENOUS

## 2014-07-16 MED ORDER — ASPIRIN EC 81 MG PO TBEC
81.0000 mg | DELAYED_RELEASE_TABLET | Freq: Every day | ORAL | Status: DC
  Administered 2014-07-17: 81 mg via ORAL
  Filled 2014-07-16: qty 1

## 2014-07-16 MED ORDER — ACETAMINOPHEN 650 MG RE SUPP: 650.0000 mg | Freq: Four times a day (QID) | RECTAL | Status: DC | PRN

## 2014-07-16 MED ORDER — ONDANSETRON HCL 4 MG/2ML IJ SOLN
INTRAMUSCULAR | Status: DC | PRN
  Administered 2014-07-16: 4 mg via INTRAVENOUS

## 2014-07-16 MED ORDER — METHYLPREDNISOLONE SODIUM SUCC 125 MG IJ SOLR
125.0000 mg | Freq: Once | INTRAMUSCULAR | Status: AC
  Administered 2014-07-16: 125 mg via INTRAVENOUS

## 2014-07-16 MED ORDER — METHYLPREDNISOLONE SODIUM SUCC 125 MG IJ SOLR
INTRAMUSCULAR | Status: AC
  Administered 2014-07-16: 125 mg via INTRAVENOUS
  Filled 2014-07-16: qty 2

## 2014-07-16 MED ORDER — LACTATED RINGERS IV SOLN
INTRAVENOUS | Status: DC | PRN
  Administered 2014-07-16: 21:00:00 via INTRAVENOUS

## 2014-07-16 MED ORDER — PROMETHAZINE HCL 25 MG/ML IJ SOLN
INTRAMUSCULAR | Status: AC
  Administered 2014-07-16: 25 mg via INTRAVENOUS
  Filled 2014-07-16: qty 1

## 2014-07-16 MED ORDER — FLECAINIDE ACETATE 50 MG PO TABS
50.0000 mg | ORAL_TABLET | Freq: Two times a day (BID) | ORAL | Status: DC
  Administered 2014-07-16 – 2014-07-17 (×2): 50 mg via ORAL
  Filled 2014-07-16 (×3): qty 1

## 2014-07-16 MED ORDER — KCL IN DEXTROSE-NACL 20-5-0.45 MEQ/L-%-% IV SOLN
INTRAVENOUS | Status: DC
  Administered 2014-07-16 – 2014-07-17 (×2): via INTRAVENOUS
  Filled 2014-07-16 (×5): qty 1000

## 2014-07-16 MED ORDER — BUPIVACAINE HCL 0.25 % IJ SOLN
INTRAMUSCULAR | Status: DC | PRN
  Administered 2014-07-16: 30 mL

## 2014-07-16 MED ORDER — MORPHINE SULFATE 2 MG/ML IJ SOLN: 2.0000 mg | INTRAMUSCULAR | Status: DC | PRN

## 2014-07-16 MED ORDER — KETOROLAC TROMETHAMINE 30 MG/ML IJ SOLN
INTRAMUSCULAR | Status: DC | PRN
  Administered 2014-07-16: 30 mg via INTRAVENOUS

## 2014-07-16 MED ORDER — LIDOCAINE HCL (CARDIAC) 20 MG/ML IV SOLN
INTRAVENOUS | Status: DC | PRN
  Administered 2014-07-16: 30 mg via INTRAVENOUS

## 2014-07-16 MED ORDER — FENTANYL CITRATE (PF) 100 MCG/2ML IJ SOLN
INTRAMUSCULAR | Status: AC
  Administered 2014-07-16: 50 ug via INTRAVENOUS
  Filled 2014-07-16: qty 2

## 2014-07-16 SURGICAL SUPPLY — 46 items
) PDS ×6 IMPLANT
CANISTER SUCT 1200ML W/VALVE (MISCELLANEOUS) ×3 IMPLANT
CHLORAPREP W/TINT 26ML (MISCELLANEOUS) ×3 IMPLANT
CLOSURE WOUND 1/2 X4 (GAUZE/BANDAGES/DRESSINGS) ×1
CUTTER LINEAR ENDO 35 ART FLEX (STAPLE) ×3 IMPLANT
CUTTER LINEAR ENDO 35 ETS (STAPLE) IMPLANT
DRAPE SHEET LG 3/4 BI-LAMINATE (DRAPES) ×3 IMPLANT
DRAPE UTILITY 15X26 TOWEL STRL (DRAPES) ×6 IMPLANT
DRESSING TELFA 4X3 1S ST N-ADH (GAUZE/BANDAGES/DRESSINGS) ×3 IMPLANT
DRSG TEGADERM 2-3/8X2-3/4 SM (GAUZE/BANDAGES/DRESSINGS) ×9 IMPLANT
DRSG TEGADERM 2X2.25 PEDS (GAUZE/BANDAGES/DRESSINGS) ×3 IMPLANT
ENDOPOUCH RETRIEVER 10 (MISCELLANEOUS) ×3 IMPLANT
GAUZE SPONGE 4X4 12PLY STRL (GAUZE/BANDAGES/DRESSINGS) IMPLANT
GLOVE BIO SURGEON STRL SZ7.5 (GLOVE) ×9 IMPLANT
GOWN STRL REUS W/ TWL LRG LVL3 (GOWN DISPOSABLE) ×1 IMPLANT
GOWN STRL REUS W/ TWL XL LVL3 (GOWN DISPOSABLE) ×1 IMPLANT
GOWN STRL REUS W/TWL LRG LVL3 (GOWN DISPOSABLE) ×2
GOWN STRL REUS W/TWL XL LVL3 (GOWN DISPOSABLE) ×2
IRRIGATION STRYKERFLOW (MISCELLANEOUS) ×1 IMPLANT
IRRIGATOR STRYKERFLOW (MISCELLANEOUS) ×3
IV NS 1000ML (IV SOLUTION) ×2
IV NS 1000ML BAXH (IV SOLUTION) ×1 IMPLANT
LABEL OR SOLS (LABEL) IMPLANT
NDL SAFETY 25GX1.5 (NEEDLE) ×3 IMPLANT
NS IRRIG 500ML POUR BTL (IV SOLUTION) ×3 IMPLANT
PACK LAP CHOLECYSTECTOMY (MISCELLANEOUS) ×3 IMPLANT
PAD GROUND ADULT SPLIT (MISCELLANEOUS) IMPLANT
RELOAD CUTTER ETS 35MM STAND (ENDOMECHANICALS) IMPLANT
SCISSORS METZENBAUM CVD 33 (INSTRUMENTS) IMPLANT
SHEARS HARMONIC ACE PLUS 36CM (ENDOMECHANICALS) ×3 IMPLANT
SLEEVE ENDOPATH XCEL 5M (ENDOMECHANICALS) ×3 IMPLANT
SLEEVE SCD COMPRESS THIGH MED (MISCELLANEOUS) ×3 IMPLANT
STRAP SAFETY BODY (MISCELLANEOUS) ×3 IMPLANT
STRIP CLOSURE SKIN 1/2X4 (GAUZE/BANDAGES/DRESSINGS) ×2 IMPLANT
SUT ETHILON 4-0 (SUTURE) ×4
SUT ETHILON 4-0 FS2 18XMFL BLK (SUTURE) ×2
SUT VIC AB 0 UR5 27 (SUTURE) IMPLANT
SUT VIC AB 4-0 RB1 27 (SUTURE)
SUT VIC AB 4-0 RB1 27X BRD (SUTURE) IMPLANT
SUTURE ETHLN 4-0 FS2 18XMF BLK (SUTURE) ×2 IMPLANT
SWABSTK COMLB BENZOIN TINCTURE (MISCELLANEOUS) ×3 IMPLANT
TROCAR XCEL 12X100 BLDLESS (ENDOMECHANICALS) ×3 IMPLANT
TROCAR XCEL BLUNT TIP 100MML (ENDOMECHANICALS) IMPLANT
TROCAR XCEL NON-BLD 5MMX100MML (ENDOMECHANICALS) ×3 IMPLANT
TUBING INSUFFLATOR HI FLOW (MISCELLANEOUS) ×3 IMPLANT
TUBING SCD EXTENSION 9995 GYN (TUBING) ×3 IMPLANT

## 2014-07-16 NOTE — Anesthesia Postprocedure Evaluation (Signed)
  Anesthesia Post-op Note  Patient: Brianna French  Procedure(s) Performed: Procedure(s): APPENDECTOMY LAPAROSCOPIC (N/A)  Anesthesia type:General  Patient location: PACU  Post pain: Pain level controlled  Post assessment: Post-op Vital signs reviewed, Patient's Cardiovascular Status Stable, Respiratory Function Stable, Patent Airway and No signs of Nausea or vomiting  Post vital signs: Reviewed and stable  Last Vitals:  Filed Vitals:   07/16/14 2204  BP: 139/78  Pulse: 102  Temp:   Resp: 20    Level of consciousness: awake, alert  and patient cooperative  Complications: No apparent anesthesia complications

## 2014-07-16 NOTE — Anesthesia Preprocedure Evaluation (Addendum)
Anesthesia Evaluation  Patient identified by MRN, date of birth, ID band Patient awake    Reviewed: Allergy & Precautions, NPO status , Patient's Chart, lab work & pertinent test results  History of Anesthesia Complications (+) DIFFICULT IV STICK / SPECIAL LINE  Airway Mallampati: II       Dental no notable dental hx.    Pulmonary neg pulmonary ROS, former smoker,    Pulmonary exam normal       Cardiovascular + dysrhythmias Atrial Fibrillation and Supra Ventricular Tachycardia Rate:Tachycardia     Neuro/Psych negative neurological ROS  negative psych ROS   GI/Hepatic negative GI ROS, Neg liver ROS,   Endo/Other  negative endocrine ROS  Renal/GU negative Renal ROS  negative genitourinary   Musculoskeletal negative musculoskeletal ROS (+)   Abdominal (+) + obese,  Abdomen: soft.    Peds negative pediatric ROS (+)  Hematology negative hematology ROS (+)   Anesthesia Other Findings   Reproductive/Obstetrics negative OB ROS                            Anesthesia Physical Anesthesia Plan  ASA: III and emergent  Anesthesia Plan: General   Post-op Pain Management:    Induction: Intravenous  Airway Management Planned: Oral ETT  Additional Equipment:   Intra-op Plan:   Post-operative Plan: Extubation in OR  Informed Consent: I have reviewed the patients History and Physical, chart, labs and discussed the procedure including the risks, benefits and alternatives for the proposed anesthesia with the patient or authorized representative who has indicated his/her understanding and acceptance.     Plan Discussed with: CRNA and Surgeon  Anesthesia Plan Comments:         Anesthesia Quick Evaluation

## 2014-07-16 NOTE — ED Provider Notes (Signed)
[facility] Emergency Department Provider Note  Time seen: 2:46 PM  I have reviewed the triage vital signs and the nursing notes.   HISTORY  Chief Complaint Abdominal Pain    HPI Brianna French is a 54 y.o. female with a past medical history of SVT, palpitations, hyperlipidemia, atrial fibrillation, who presents the emergency department for right lower quadrant abdominal pain. According to the patient she awoke around 3 AM with periumbilical abdominal pain that has migrated to the right lower quadrant. Patient now with nausea as well. States the abdominal pain has been worsening. Denies any vomiting, diarrhea, dysuria. Patient doesn't history of ovarian cysts but this does not feel similar to those. Patient describes the pain as dull, 10/10 in the right lower quadrant somewhat worse with motion/movement. Has not attempted to eat or drink.     Past Medical History  Diagnosis Date  . PSVT (paroxysmal supraventricular tachycardia)   . Mixed hyperlipidemia   . Daytime somnolence   . Palpitations   . Arrhythmia   . Chickenpox   . Hyperlipemia   . Ovarian cyst   . Plantar fasciitis of left foot   . Chest pain     NM Myocardial Scan 08/31/12   . Atrial fibrillation     Patient Active Problem List   Diagnosis Date Noted  . Atrial fibrillation 01/19/2014  . Dyslipidemia 01/19/2014  . Daytime somnolence 01/19/2014    Past Surgical History  Procedure Laterality Date  . Knee arthroscopy Right 2007  . Bilateral knee arthroscopy  05/06/10  . Trigger finger release Right 06/06/11    Right ring finger  . Ovarian cyst surgery  1980    Current Outpatient Rx  Name  Route  Sig  Dispense  Refill  . aspirin EC 81 MG tablet   Oral   Take 1 tablet (81 mg total) by mouth daily.   90 tablet   3   . Cholecalciferol (VITAMIN D3) 2000 UNITS TABS      Take 1 tablet by mouth everyday except take 2 tablets by mouth on Sunday         . clotrimazole-betamethasone  (LOTRISONE) cream      APPLY A SMALL AMOUNT TO AFFECTED AREA 2 TIMES A DAY AS DIRECTED         . flecainide (TAMBOCOR) 100 MG tablet   Oral   Take 0.5 tablets (50 mg total) by mouth 2 (two) times daily.   90 tablet   3   . magnesium oxide (MAG-OX) 400 MG tablet   Oral   Take 400 mg by mouth daily.          . rosuvastatin (CRESTOR) 10 MG tablet      TAKE ONE TABLET BY MOUTH IN THE MORNING         . venlafaxine XR (EFFEXOR-XR) 150 MG 24 hr capsule   Oral   Take 1 capsule by mouth every morning.      3     Allergies Ivp dye; Other; and Pneumococcal vac polyvalent  Family History  Problem Relation Age of Onset  . Hypertension Mother   . Diabetes type II Mother   . Asthma Mother   . Mental illness Mother   . Heart attack Father   . Hypertension Sister   . Diabetes type II Sister   . Diabetes type II Other   . Cancer Other   . CAD      Fam Hx of CAD    Social  History History  Substance Use Topics  . Smoking status: Former Smoker    Types: Cigarettes    Quit date: 03/04/1979  . Smokeless tobacco: Never Used  . Alcohol Use: 0.0 oz/week    0 Standard drinks or equivalent per week    Review of Systems Constitutional: Negative for fever. Cardiovascular: Negative for chest pain. Respiratory: Negative for shortness of breath. Gastrointestinal: Positive for mid to right lower quadrant abdominal pain, and Nausea. Denies vomiting, diarrhea. Genitourinary: Negative for dysuria. Musculoskeletal: Negative for back pain. 10-point ROS otherwise negative.  ____________________________________________   PHYSICAL EXAM:  VITAL SIGNS: ED Triage Vitals  Enc Vitals Group     BP 07/16/14 1405 140/78 mmHg     Pulse Rate 07/16/14 1405 106     Resp --      Temp 07/16/14 1405 98.8 F (37.1 C)     Temp Source 07/16/14 1405 Oral     SpO2 07/16/14 1405 99 %     Weight 07/16/14 1405 230 lb (104.327 kg)     Height 07/16/14 1405 5\' 5"  (1.651 m)     Head Cir --       Peak Flow --      Pain Score --      Pain Loc --      Pain Edu? --      Excl. in [place]? --     Constitutional: Alert and oriented. Well appearing and in no distress. Eyes: Normal exam ENT   Mouth/Throat: Mucous membranes are moist. Cardiovascular: Normal rate, regular rhythm. No murmurs, rubs, or gallops. Respiratory: Normal respiratory effort without tachypnea nor retractions. Breath sounds are clear and equal bilaterally. No wheezes/rales/rhonchi. Gastrointestinal: Soft, moderate right lower quadrant tenderness to palpation, positive rebound, no guarding. No distention. Musculoskeletal: Nontender with normal range of motion in all extremities.  Neurologic:  Normal speech and language. No gross focal neurologic deficits  Skin:  Skin is warm, dry and intact.  Psychiatric: Mood and affect are normal. Speech and behavior are normal.   ____________________________________________     RADIOLOGY  CT abdomen and pelvis consistent with acute appendicitis.  ____________________________________________    INITIAL IMPRESSION / ASSESSMENT AND PLAN / ED COURSE  Pertinent labs & imaging results that were available during my care of the patient were reviewed by me and considered in my medical decision making (see chart for details).  54 year old female with approximately 12 hours of right lower quadrant abdominal pain which has gradually worsened. Now with nausea. Highly suspect appendicitis, we'll proceed with CT scan. Patient states she has had IV contrast several times since having hives along time ago. She has not needed premedication and has not had any allergic reaction since. The patient does not believe she is allergic to IV dye.   CT scan consistent with acute appendicitis. Informed patient. I discussed with Dr. Marina Gravel. He will be seeing the patient ____________________________________________   FINAL CLINICAL IMPRESSION(S) / ED DIAGNOSES  Right lower quadrant abdominal  pain Acute appendicitis   Harvest Dark, MD 07/16/14 [phone]

## 2014-07-16 NOTE — H&P (Signed)
Brianna French is an 54 y.o. female.    Chief Complaint: Abdominal pain since 3:00 this morning.  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 The pain has been unrelenting constant and has migrated itself to the right lower quadrant. She has had anorexia. There is been no fever no chills no sick contacts no alleviating factors. There is been no nausea and no vomiting and no diarrhea. The patient states that she had similar type pain when she was 19 resulting from a ruptured ovarian cyst requiring laparoscopy.  Workup and Sherman suggestive of acute appendicitis. Incidental finding is made of a small 3 cm left adrenal versus hepatic mass.   Past Medical History  Diagnosis Date  . PSVT (paroxysmal supraventricular tachycardia)   . Mixed hyperlipidemia   . Daytime somnolence   . Palpitations   . Arrhythmia   . Chickenpox   . Hyperlipemia   . Ovarian cyst   . Plantar fasciitis of left foot   . Chest pain     NM Myocardial Scan 08/31/12   . Atrial fibrillation     Past Surgical History  Procedure Laterality Date  . Knee arthroscopy Right 2007  . Bilateral knee arthroscopy  05/06/10  . Trigger finger release Right 06/06/11    Right ring finger  . Ovarian cyst surgery  1980    Family History  Problem Relation Age of Onset  . Hypertension Mother   . Diabetes type II Mother   . Asthma Mother   . Mental illness Mother   . Heart attack Father   . Hypertension Sister   . Diabetes type II Sister   . Diabetes type II Other   . Cancer Other   . CAD      Fam Hx of CAD   Social History:  reports that she quit smoking about 35 years ago. Her smoking use included Cigarettes. She has never used smokeless tobacco. She reports that she drinks alcohol. Her drug history is not on file.  Allergies:  Allergies   Allergen Reactions  . Ivp Dye [Iodinated Diagnostic Agents]     ALSO VICRYL CAUSES OPEN SORES/BLISTERS  . Other Other (See Comments)    Vicryl suture causes sores/blisters, narcotics make her sick  . Pneumococcal Vac Polyvalent Other (See Comments)    Fever, edema     (Not in a hospital admission)   Review of Systems:   Review of Systems  Constitutional: Negative for fever, chills, weight loss, malaise/fatigue and diaphoresis.  Eyes: Negative.   Respiratory: Negative for cough, shortness of breath and wheezing.   Cardiovascular: Negative.   Gastrointestinal: Positive for abdominal pain. Negative for nausea, vomiting, diarrhea and constipation.  Musculoskeletal: Positive for back pain.  Skin: Negative.   Neurological: Positive for headaches.  Endo/Heme/Allergies: Negative.   Psychiatric/Behavioral: Negative.   All other systems reviewed and are negative.   Physical Exam:  Physical Exam  Constitutional: She is oriented to person, place, and time and well-developed, well-nourished, and in no distress. No distress.  HENT:  Head: Normocephalic and atraumatic.  Eyes: Conjunctivae and EOM are normal. Pupils are equal, round, and reactive to light.  Neck: Neck supple. No tracheal deviation present. No thyromegaly present.  Cardiovascular: Normal rate, regular rhythm and normal heart sounds.   Pulmonary/Chest: Effort normal and breath sounds normal. No respiratory distress.  She has no wheezes.  Abdominal: Soft. There is tenderness. There is rebound.  Positive Rovsing sign and focal tenderness at McBurney's point.  Neurological: She is alert and oriented to person, place, and time.  Skin: Skin is warm and dry. She is not diaphoretic.  Psychiatric: Mood, memory, affect and judgment normal.    Blood pressure 123/70, pulse 89, temperature 99.5 F (37.5 C), temperature source Oral, resp. rate 20, height $RemoveBe'5\' 5"'SukGwbaJD$  (1.651 m), weight 104.327 kg (230 lb), SpO2 95 %.    Results for  orders placed or performed during the hospital encounter of 07/16/14 (from the past 48 hour(s))  Comprehensive metabolic panel     Status: Abnormal   Collection Time: 07/16/14  3:11 PM  Result Value Ref Range   Sodium 137 135 - 145 mmol/L   Potassium 3.8 3.5 - 5.1 mmol/L   Chloride 104 101 - 111 mmol/L   CO2 23 22 - 32 mmol/L   Glucose, Bld 129 (H) 65 - 99 mg/dL   BUN 14 6 - 20 mg/dL   Creatinine, Ser 0.57 0.44 - 1.00 mg/dL   Calcium 9.1 8.9 - 10.3 mg/dL   Total Protein 7.1 6.5 - 8.1 g/dL   Albumin 3.9 3.5 - 5.0 g/dL   AST 25 15 - 41 U/L   ALT 26 14 - 54 U/L   Alkaline Phosphatase 85 38 - 126 U/L   Total Bilirubin 0.4 0.3 - 1.2 mg/dL   GFR calc non Af Amer >60 >60 mL/min   GFR calc Af Amer >60 >60 mL/min    Comment: (NOTE) The eGFR has been calculated using the CKD EPI equation. This calculation has not been validated in all clinical situations. eGFR's persistently <60 mL/min signify possible Chronic Kidney Disease.    Anion gap 10 5 - 15  CBC with Differential     Status: Abnormal   Collection Time: 07/16/14  3:11 PM  Result Value Ref Range   WBC 14.5 (H) 3.6 - 11.0 K/uL   RBC 4.57 3.80 - 5.20 MIL/uL   Hemoglobin 14.4 12.0 - 16.0 g/dL   HCT 43.2 35.0 - 47.0 %   MCV 94.5 80.0 - 100.0 fL   MCH 31.4 26.0 - 34.0 pg   MCHC 33.2 32.0 - 36.0 g/dL   RDW 12.8 11.5 - 14.5 %   Platelets 205 150 - 440 K/uL   Neutrophils Relative % 82 %   Neutro Abs 11.9 (H) 1.4 - 6.5 K/uL   Lymphocytes Relative 7 %   Lymphs Abs 1.0 1.0 - 3.6 K/uL   Monocytes Relative 11 %   Monocytes Absolute 1.6 (H) 0.2 - 0.9 K/uL   Eosinophils Relative 0 %   Eosinophils Absolute 0.0 0 - 0.7 K/uL   Basophils Relative 0 %   Basophils Absolute 0.0 0 - 0.1 K/uL  Lipase, blood     Status: None   Collection Time: 07/16/14  3:11 PM  Result Value Ref Range   Lipase 29 22 - 51 U/L  Urinalysis complete, with microscopic     Status: Abnormal   Collection Time: 07/16/14  5:28 PM  Result Value Ref Range   Color,  Urine STRAW (A) YELLOW   APPearance CLEAR (A) CLEAR   Glucose, UA NEGATIVE NEGATIVE mg/dL   Bilirubin Urine NEGATIVE NEGATIVE   Ketones, ur NEGATIVE NEGATIVE mg/dL   Specific Gravity, Urine 1.006 1.005 - 1.030   Hgb urine dipstick 1+ (A) NEGATIVE   pH 7.0 5.0 - 8.0   Protein, ur  NEGATIVE NEGATIVE mg/dL   Nitrite NEGATIVE NEGATIVE   Leukocytes, UA NEGATIVE NEGATIVE   RBC / HPF 0-5 0 - 5 RBC/hpf   WBC, UA 0-5 0 - 5 WBC/hpf   Bacteria, UA NONE SEEN NONE SEEN   Squamous Epithelial / LPF 0-5 (A) NONE SEEN   Ct Abdomen Pelvis W Contrast  07/16/2014   CLINICAL DATA:  Acute onset of right lower quadrant abdominal pain. Initial encounter.  EXAM: CT ABDOMEN AND PELVIS WITH CONTRAST  TECHNIQUE: Multidetector CT imaging of the abdomen and pelvis was performed using the standard protocol following bolus administration of intravenous contrast.  CONTRAST:  135mL OMNIPAQUE IOHEXOL 300 MG/ML  SOLN  COMPARISON:  None.  FINDINGS: Minimal bibasilar atelectasis is noted.  The liver and spleen are unremarkable in appearance. The gallbladder is within normal limits. The pancreas and left adrenal gland are unremarkable. A 3.0 cm mass is noted arising at the right adrenal gland.  The kidneys are unremarkable in appearance. There is no evidence of hydronephrosis. No renal or ureteral stones are seen. No perinephric stranding is appreciated. Two left-sided ureters are incidentally seen.  The small bowel is unremarkable in appearance. The stomach is within normal limits. No acute vascular abnormalities are seen. Mild scattered calcification is seen along the abdominal aorta and its branches.  The appendix is enlarged, measuring up to 1.2 cm in diameter, with surrounding soft tissue inflammation, compatible with mild acute appendicitis. Trace associated free fluid is seen. There is no evidence of perforation or abscess formation at this time. The appendix is partially retrocecal in nature.  Contrast progresses to the level of  the transverse colon. The colon is unremarkable in appearance.  The bladder is moderately distended and grossly unremarkable. The uterus is unremarkable in appearance. The ovaries are relatively symmetric. No suspicious adnexal masses are seen. No inguinal lymphadenopathy is seen.  No acute osseous abnormalities are identified.  IMPRESSION: 1. Acute appendicitis noted, with dilatation of the appendix to 1.2 cm in diameter, and surrounding soft tissue inflammation. Trace associated free fluid seen. No evidence of perforation or abscess formation. The appendix is partially retrocecal in nature. 2. 3.0 cm right adrenal gland mass noted. Adrenal protocol MRI or CT would be helpful for further evaluation, when and as deemed clinically appropriate. 3. Mild scattered calcification along the abdominal aorta and its branches.  These results were called by telephone at the time of interpretation on 07/16/2014 at 6:57 pm to Dr. Harvest Dark, who verbally acknowledged these results.   Electronically Signed   By: Garald Balding M.D.   On: 07/16/2014 18:58     Assessment/Plan 54 year old white female with a history of intermittent atrial fibrillation treated with antiarrhythmics presents with sudden onset of abdominal pain workup was consistent with early acute appendicitis. Incidental note is also made is a LEFT adrenal mass which upon my review as well as radiology may or may not actually be in the left adrenal gland as may be in the liver parenchyma. At any rate this be followed up with her primary care physician future.  I discussed this with her in detail and she understands.  We discussed with her and her family present laparoscopic appendectomy performed under general anesthesia the risk of needing an open operation and the small risk of bleeding infection abscess formation as well as bowel injury. All her questions were answered. Plan is for surgery later tonight preoperative orders were written including  cefoxitin.   Hortencia Conradi, MD, FACS

## 2014-07-16 NOTE — Anesthesia Procedure Notes (Signed)
Procedure Name: Intubation Performed by: Kennon Holter Pre-anesthesia Checklist: Patient identified, Emergency Drugs available, Suction available, Patient being monitored and Timeout performed Patient Re-evaluated:Patient Re-evaluated prior to inductionOxygen Delivery Method: Circle system utilized Preoxygenation: Pre-oxygenation with 100% oxygen Intubation Type: IV induction Ventilation: Mask ventilation without difficulty Laryngoscope Size: 3 and Mac Grade View: Grade I Tube type: Oral Tube size: 7.0 mm Airway Equipment and Method: Stylet Placement Confirmation: ETT inserted through vocal cords under direct vision,  positive ETCO2,  breath sounds checked- equal and bilateral and CO2 detector Secured at: 21 cm Tube secured with: Tape Dental Injury: Teeth and Oropharynx as per pre-operative assessment

## 2014-07-16 NOTE — Op Note (Signed)
07/16/2014  10:10 PM  PATIENT:  Brianna French  54 y.o. female  PRE-OPERATIVE DIAGNOSIS:  acute appendicitis  POST-OPERATIVE DIAGNOSIS:  acute appendicitis  PROCEDURE:  Procedure(s): APPENDECTOMY LAPAROSCOPIC (N/A)  SURGEON:  Surgeon(s) and Role:    Sherri Rad, MD FACS - Primary  ASSISTANTS: scrub tech  ANESTHESIA:  General and 30 cc 0.25% plain Marcaine.  SPECIMEN:  Appendix to pathology  EBL: minimal.  Description of procedure:    With informed consent supine position and general oral endotracheal anesthesia the patient's left arm was padded and tucked at her side and the abdomen was widely prepped with Chlorpro.    A timeout was observed.   A 12 mm blunt Hassan trocar was placed through an infraumbilical transversely oriented skin incision with stay sutures of #0 PDS placed through the fascia.  Peritoneum was established. A 5 mm blade less trocar was placed in the right upper quadrant and an additional 5 mm bladed trocar was placed in the left lower quadrant. Examination of the pelvis demonstrated normal.  Left and right ovaries as well as uterus photodocumentation was obtained.   The appendix was adherent to terminal ileum.  There was evidence of suppurative appendicitis. There was no evidence of perforation. The appendix was bluntly dissected off the terminal ileum and ileocecal sail with the base of the appendix being clearly identified a window was created between the mesial appendix and the base of the appendix with blunt technique. The mesoappendix was then divided utilizing the Harmonic scalpel apparatus was advanced hemostasis feature.  The appendix was identified at the confluence of the tinea. Utilizing a 5 mm surgical telescope through the right upper quadrant trocar site the stapler was introduced through the 12 mm Hassan and fired across the base of the appendix. The specimen was captured in an Endo Catch device and retrieved. Pneumoperitoneum was then reestablished  and the right lower quadrant was irrigated with a total of 1 L of warm normal saline and aspirated dry. Hemostasis appeared be adequate on the operative field and ports were then removed under direct visualization. A total of 30 cc of 0.25% plain Marcaine was infiltrated along all skin incision and fascial incisions prior to closure. Due to the patient's Vicryl allergy an additional #0 PDS suture was placed in vertical orientation at the fascial incision site at the umbilicus and skin edges were re approximated utilizing a combination of simple and vertical mattress 4-0 nylon suture.   Telfa and Tegaderm were then applied.    The patient was subsequently extubated and taken to the recovery room in stable and satisfactory condition by anesthesia services.   Hortencia Conradi, MD FACS.

## 2014-07-16 NOTE — Transfer of Care (Signed)
Immediate Anesthesia Transfer of Care Note  Patient: Brianna French  Procedure(s) Performed: Procedure(s): APPENDECTOMY LAPAROSCOPIC (N/A)  Patient Location: PACU  Anesthesia Type:General  Level of Consciousness: sedated  Airway & Oxygen Therapy: Patient Spontanous Breathing and Patient connected to face mask oxygen  Post-op Assessment: Report given to RN  Post vital signs: stable  Last Vitals:  Filed Vitals:   07/16/14 2203  BP:   Pulse:   Temp: 37.8 C  Resp:     Complications: No apparent anesthesia complications

## 2014-07-17 ENCOUNTER — Encounter: Payer: Self-pay | Admitting: Surgery

## 2014-07-17 MED ORDER — HYDROCODONE-ACETAMINOPHEN 5-325 MG PO TABS: 2.0000 | ORAL_TABLET | Freq: Four times a day (QID) | ORAL | Status: DC | PRN

## 2014-07-17 MED ORDER — HYDROCODONE-ACETAMINOPHEN 5-325 MG PO TABS: 1.0000 | ORAL_TABLET | ORAL | Status: DC | PRN

## 2014-07-17 NOTE — Progress Notes (Signed)
Discharge  Pt was given discharge instructions with family at the bedside. Husband was sent to get the car while RN called for a wheelchair. Prescription was given to patient with discharge instructions. At time of discharge, pt had complaints of pain 4/10, but refused pain medications.   Pt was called after discharge to inform her of her f/u appt scheduled by secretary during her discharge.

## 2014-07-17 NOTE — Discharge Summary (Signed)
Physician Discharge Summary  Patient ID: Brianna French MRN: [id number] DOB/AGE: 56-Jul-1962 56 y.o.  Admit date: 07/16/2014 Discharge date: 07/17/2014  Admission Diagnoses: Acute appendicitis.  Discharge Diagnoses:  Active Problems:   Atrial fibrillation   Dyslipidemia   Daytime somnolence   Adrenal mass, left   Appendicitis  Hospital course summary:  Patient was admitted to the emergency room with acute appendicitis. She was taken to the operating room for laparoscopic appendectomy which was uncomplicated. Postoperatively she had adequate pain control dry dressings tolerating a liquid diet was ambulating well and was discharged home in stable condition. Note during her workup in the emergency room was noted of the left adrenal incidental lipoma measuring 3 cm. This will be followed up with myself or with Dr. Doy Hutching her PCP as an outpatient.                              Discharged Condition: Stable and improved   Significant Diagnostic Studies: CT scan abdomen and pelvis  Treatments: Laparoscopic appendectomy  Discharge Exam: Soft nontender examination with dry dressings.  Disposition:  Home with care. Discharge Instructions    Diet general    Complete by:  As directed      Discharge instructions    Complete by:  As directed   DISCHARGE INSTRUCTIONS TO PATIENT  REMINDER:  Carry a list of your medications and allergies with you at all times Call your pharmacy at least 1 week in advance to refill prescriptions Do not mix any prescribed pain medicine with alcohol Do not drive any motor vehicles while taking pain medication. Take medications with food.  Do not retake a pain medication if you vomit after taking it.  Activity: no lifting more than 15 pounds until instructed by your doctor.   Dressing Care Instruction (if applicable):   May Shower-  Call office if any questions regarding this activity.  Dry Dressing as needed to operative site.  Drain care instructions  provided to you in the hospital.   Follow-up appointments (date to return to physician): Call for appointment with Dr. Sherri Rad, MD at [phone] or [phone]  If need MD on call after hours and on weekends call Hospital operator at [phone] as ask to speak to Surgeon on call for [facility].  Call Surgeon if you have: Temperature greater than 100.4 Persistent nausea and vomiting Severe uncontrolled pain Redness, tenderness, or signs of infection (pain, swelling, redness, odor or green/yellow discharge around the site) Difficulty breathing, headache or visual disturbances Hives Persistent dizziness or light-headedness Extreme fatigue Any other questions or concerns you may have after discharge  In an emergency, call 911 or go to an Emergency Department at a nearby hospital  Diet:  Resume your usual diet.  Avoid spicy, greasy or heavy foods.  If you have nausea or vomiting, go back to liquids.  If you cannot keep liquids down, call your doctor.  Avoid alcohol consumption while on prescription pain medications. Good nutrition promotes healing. Increase fiber and fluids.     I understand and acknowledge receipt of the above instructions.  Patient or Guardian Signature                                                                    Date/Time                                                                                                                                        Physician's or R.N.'s Signature                                                                  Date/Time  The discharge instructions have been reviewed with the patient and/or Family Member/Parent/Guardian.  Patient and/or Family Member/Parent/Guardian signed and retained a printed copy.     Increase activity slowly    Complete by:  As directed       Lifting restrictions    Complete by:  As directed   No more than 15 lbs for 10 days.     Remove dressing in 48 hours    Complete by:  As directed             Medication List    TAKE these medications        aspirin EC 81 MG tablet  Take 1 tablet (81 mg total) by mouth daily.     clotrimazole-betamethasone cream  Commonly known as:  LOTRISONE  APPLY A SMALL AMOUNT TO AFFECTED AREA 2 TIMES A DAY AS DIRECTED     CRESTOR 10 MG tablet  Generic drug:  rosuvastatin  TAKE ONE TABLET BY MOUTH IN THE MORNING     flecainide 100 MG tablet  Commonly known as:  TAMBOCOR  Take 0.5 tablets (50 mg total) by mouth 2 (two) times daily.     HYDROcodone-acetaminophen 5-325 MG per tablet  Commonly known as:  NORCO/VICODIN  Take 1-2 tablets by mouth every 4 (four) hours as needed for moderate pain.     ibuprofen 200 MG tablet  Commonly known as:  ADVIL,MOTRIN  Take 800 mg by mouth every 6 (six) hours as needed for moderate pain.     magnesium oxide 400 MG tablet  Commonly known as:  MAG-OX  Take 400 mg by mouth daily.     ranitidine 150 MG tablet  Commonly known as:  ZANTAC  Take 150 mg by mouth daily as needed for heartburn.     venlafaxine XR 150 MG 24 hr capsule  Commonly known as:  EFFEXOR-XR  Take 1 capsule by mouth every morning.     Vitamin D3 2000 UNITS Tabs  Take 1 tablet by mouth everyday except take 2 tablets by mouth on Sunday           Follow-up Information    Follow up with Brogen Duell, MD In 1 week.   Specialties:  Surgery, Radiology   Why:  [place] office location with Dr Rexene Edison., For suture removal   Contact information:   [address] [place] [address] [phone]       Follow up with Nicklaus Alviar, MD In 1 week.   Specialties:  Surgery, Radiology   Why:  For suture removal   Contact information:   [address] [place] [address] [phone]       Signed: Sherri Rad 07/17/2014, 10:54 AM

## 2014-07-17 NOTE — Progress Notes (Signed)
Patient ID: Brianna French, female   DOB: Jun 07, 1960, 54 y.o.   MRN: [id number] Postoperative day #1 status post laparoscopic appendectomy. The patient is doing well. There was some drainage from her umbilical incision. Dressing was reinforced and now is dry. She tolerated clear liquids. Pain control is adequate. Abdomen is soft and non tender. He is stable and improved for discharge. Follow-up in one week for suture removal with [facility].

## 2014-07-19 LAB — SURGICAL PATHOLOGY

## 2015-01-22 ENCOUNTER — Other Ambulatory Visit: Payer: Self-pay | Admitting: Internal Medicine

## 2015-03-23 ENCOUNTER — Other Ambulatory Visit
Admission: RE | Admit: 2015-03-23 | Discharge: 2015-03-23 | Disposition: A | Discharge status: Home / Self Care | Payer: 59 | Source: Ambulatory Visit | Attending: Internal Medicine | Admitting: Internal Medicine

## 2015-03-23 DIAGNOSIS — Z79899 Other long term (current) drug therapy: Secondary | ICD-10-CM | POA: Insufficient documentation

## 2015-03-23 DIAGNOSIS — E782 Mixed hyperlipidemia: Secondary | ICD-10-CM | POA: Insufficient documentation

## 2015-03-23 LAB — LIPID PANEL
CHOLESTEROL: 183 mg/dL (ref 0–200)
HDL: 57 mg/dL (ref >40)
LDL Cholesterol: 111 mg/dL — ABNORMAL HIGH (ref 0–99)
Total CHOL/HDL Ratio: 3.2 RATIO
Triglycerides: 74 mg/dL (ref <150)
VLDL: 15 mg/dL (ref 0–40)

## 2015-03-23 LAB — CBC WITH DIFFERENTIAL/PLATELET
Basophils Absolute: 0 10*3/uL (ref 0–0.1)
Basophils Relative: 1 %
EOS ABS: 0.2 10*3/uL (ref 0–0.7)
EOS PCT: 5 %
HCT: 39.7 % (ref 35.0–47.0)
HEMOGLOBIN: 13.3 g/dL (ref 12.0–16.0)
LYMPHS ABS: 1.3 10*3/uL (ref 1.0–3.6)
Lymphocytes Relative: 32 %
MCH: 30.7 pg (ref 26.0–34.0)
MCHC: 33.5 g/dL (ref 32.0–36.0)
MCV: 91.6 fL (ref 80.0–100.0)
MONOS PCT: 15 %
Monocytes Absolute: 0.6 10*3/uL (ref 0.2–0.9)
NEUTROS PCT: 47 %
Neutro Abs: 2 10*3/uL (ref 1.4–6.5)
Platelets: 221 10*3/uL (ref 150–440)
RBC: 4.33 MIL/uL (ref 3.80–5.20)
RDW: 13.6 % (ref 11.5–14.5)
WBC: 4.2 10*3/uL (ref 3.6–11.0)

## 2015-03-23 LAB — URINALYSIS COMPLETE WITH MICROSCOPIC (ARMC ONLY)
BILIRUBIN URINE: NEGATIVE
Bacteria, UA: NONE SEEN
Glucose, UA: NEGATIVE mg/dL
KETONES UR: NEGATIVE mg/dL
Leukocytes, UA: NEGATIVE
Nitrite: NEGATIVE
Protein, ur: NEGATIVE mg/dL
Specific Gravity, Urine: 1.024 (ref 1.005–1.030)
pH: 5 (ref 5.0–8.0)

## 2015-03-23 LAB — COMPREHENSIVE METABOLIC PANEL
ALBUMIN: 3.9 g/dL (ref 3.5–5.0)
ALT: 27 U/L (ref 14–54)
AST: 25 U/L (ref 15–41)
Alkaline Phosphatase: 79 U/L (ref 38–126)
Anion gap: 6 (ref 5–15)
BUN: 23 mg/dL — AB (ref 6–20)
CHLORIDE: 105 mmol/L (ref 101–111)
CO2: 25 mmol/L (ref 22–32)
CREATININE: 0.65 mg/dL (ref 0.44–1.00)
Calcium: 9 mg/dL (ref 8.9–10.3)
GFR calc Af Amer: >60 mL/min (ref >60)
GFR calc non Af Amer: >60 mL/min (ref >60)
Glucose, Bld: 128 mg/dL — ABNORMAL HIGH (ref 65–99)
Potassium: 4 mmol/L (ref 3.5–5.1)
SODIUM: 136 mmol/L (ref 135–145)
Total Bilirubin: 0.5 mg/dL (ref 0.3–1.2)
Total Protein: 6.9 g/dL (ref 6.5–8.1)

## 2015-03-23 LAB — TSH: TSH: 2.77 u[IU]/mL (ref 0.350–4.500)

## 2015-03-23 LAB — HEMOGLOBIN A1C: HEMOGLOBIN A1C: 5.8 % (ref 4.0–6.0)

## 2015-03-24 LAB — VITAMIN D 25 HYDROXY (VIT D DEFICIENCY, FRACTURES): VIT D 25 HYDROXY: 36.4 ng/mL (ref 30.0–100.0)

## 2015-03-29 ENCOUNTER — Encounter: Payer: Self-pay | Admitting: Internal Medicine

## 2015-03-29 ENCOUNTER — Ambulatory Visit (INDEPENDENT_AMBULATORY_CARE_PROVIDER_SITE_OTHER): Payer: 59 | Admitting: Internal Medicine

## 2015-03-29 VITALS — BP 146/80 | HR 92 | Ht 65.0 in | Wt 240.6 lb

## 2015-03-29 DIAGNOSIS — E785 Hyperlipidemia, unspecified: Secondary | ICD-10-CM | POA: Diagnosis not present

## 2015-03-29 DIAGNOSIS — I480 Paroxysmal atrial fibrillation: Secondary | ICD-10-CM

## 2015-03-29 MED ORDER — FLECAINIDE ACETATE 100 MG PO TABS: 100.0000 mg | ORAL_TABLET | Freq: Two times a day (BID) | ORAL | Status: DC

## 2015-03-29 NOTE — Progress Notes (Signed)
HPI [name] returns today for follow-up. She is a very pleasant 55 year old woman with a history of palpitations and paroxysmal atrial arrhythmias. When I saw the patient several months ago, we recommended initiation of low-dose flecainide. Her symptoms are much improved. She admits taking her flecainide every morning, but does occasionally miss her evening dose. She has begun to develop worsening recurrent symptoms over the past few months. She has frequent breakthroughs of atrial fib which will last upto 4 hours. No syncope.  Allergies  Allergen Reactions  . Ivp Dye [Iodinated Diagnostic Agents]     ALSO VICRYL CAUSES OPEN SORES/BLISTERS  . Meprobamate-Aspirin Other (See Comments)  . Other Other (See Comments)    Vicryl suture causes sores/blisters, narcotics make her sick  . Pneumococcal Vac Polyvalent Other (See Comments)    Fever, edema  . Pneumococcal Vaccine Other (See Comments)    Fever, edema     Current Outpatient Prescriptions  Medication Sig Dispense Refill  . aspirin EC 81 MG tablet Take 1 tablet (81 mg total) by mouth daily. 90 tablet 3  . Cholecalciferol (VITAMIN D3) 2000 UNITS TABS Take 1 tablet by mouth everyday except take 2 tablets by mouth on Sunday    . clotrimazole-betamethasone (LOTRISONE) cream APPLY A SMALL AMOUNT TO AFFECTED AREA 2 TIMES A DAY AS DIRECTED    . flecainide (TAMBOCOR) 100 MG tablet Take 1 tablet (100 mg total) by mouth 2 (two) times daily. 180 tablet 3  . ibuprofen (ADVIL,MOTRIN) 200 MG tablet Take 800 mg by mouth every 6 (six) hours as needed for moderate pain.    . magnesium oxide (MAG-OX) 400 MG tablet Take 400 mg by mouth daily.     . NON FORMULARY Patient stated she is taking an old weight loss medication but can not recall the name of it.    . ranitidine (ZANTAC) 150 MG tablet Take 150 mg by mouth daily as needed for heartburn.    . rosuvastatin (CRESTOR) 10 MG tablet TAKE ONE TABLET BY MOUTH IN THE MORNING    . venlafaxine XR  (EFFEXOR-XR) 150 MG 24 hr capsule Take 1 capsule by mouth every morning.  3   No current facility-administered medications for this visit.     Past Medical History  Diagnosis Date  . PSVT (paroxysmal supraventricular tachycardia) ([place])   . Mixed hyperlipidemia   . Daytime somnolence   . Palpitations   . Arrhythmia   . Chickenpox   . Hyperlipemia   . Ovarian cyst   . Plantar fasciitis of left foot   . Chest pain     NM Myocardial Scan 08/31/12   . Atrial fibrillation ([place])     ROS:   All systems reviewed and negative except as noted in the HPI.   Past Surgical History  Procedure Laterality Date  . Knee arthroscopy Right 2007  . Bilateral knee arthroscopy  05/06/10  . Trigger finger release Right 06/06/11    Right ring finger  . Ovarian cyst surgery  1980  . Replacement total knee bilateral Bilateral   . Appendectomy  07-16-14    Dr. Marina Gravel  . Laparoscopic appendectomy N/A 07/16/2014    Procedure: APPENDECTOMY LAPAROSCOPIC;  Surgeon: Sherri Rad, MD;  Location: [facility] ORS;  Service: General;  Laterality: N/A;     Family History  Problem Relation Age of Onset  . Hypertension Mother   . Diabetes type II Mother   . Asthma Mother   . Mental illness Mother   .  Heart attack Father   . Hypertension Sister   . Diabetes type II Sister   . Diabetes type II Other   . Cancer Other   . CAD      Fam Hx of CAD     Social History   Social History  . Marital Status: Married    Spouse Name: N/A  . Number of Children: N/A  . Years of Education: N/A   Occupational History  . Not on file.   Social History Main Topics  . Smoking status: Former Smoker    Types: Cigarettes    Quit date: 03/04/1979  . Smokeless tobacco: Never Used  . Alcohol Use: No  . Drug Use: Not on file  . Sexual Activity: Yes   Other Topics Concern  . Not on file   Social History Narrative     BP 146/80 mmHg  Pulse 92  Ht 5\' 5"  (1.651 m)  Wt 240 lb 9.6 oz (109.135 kg)  BMI 40.04 kg/m2  Physical  Exam:  Well appearing obese, middle-aged woman, NAD HEENT: Unremarkable Neck:  No JVD, no thyromegally Lymphatics:  No adenopathy Back:  No CVA tenderness Lungs:  Clear with no wheezes, rales, or rhonchi. HEART:  Regular rate rhythm, no murmurs, no rubs, no clicks Abd:  soft, positive bowel sounds, no organomegally, no rebound, no guarding Ext:  2 plus pulses, no edema, no cyanosis, no clubbing Skin:  No rashes no nodules Neuro:  CN II through XII intact, motor grossly intact  EKG Normal sinus rhythm. QRS - 88 ms  Assess/Plan:

## 2015-03-29 NOTE — Patient Instructions (Addendum)
Medication Instructions:  Your physician has recommended you make the following change in your medication:  1) Increase Flecainide to 100mg  twice daily    Labwork: None ordered   Testing/Procedures: None ordered   Follow-Up:  Your physician recommends that you schedule a follow-up appointment in: 2 weeks for a GXT with PA  Your physician wants you to follow-up in: 6 months with Dr Knox Saliva will receive a reminder letter in the mail two months in advance. If you don't receive a letter, please call our office to schedule the follow-up appointment.    Any Other Special Instructions Will Be Listed Below (If Applicable).     If you need a refill on your cardiac medications before your next appointment, please call your pharmacy.

## 2015-03-29 NOTE — Assessment & Plan Note (Signed)
Her symptoms have worsened. I have discussed the treatment options and recommended she increase her dose of flecainide to 100 mg twice daily. Will schedule an exercise test.

## 2015-03-29 NOTE — Assessment & Plan Note (Signed)
She is encouraged to lose weight and reduce her sodium and fat intake.

## 2015-04-05 DIAGNOSIS — L403 Pustulosis palmaris et plantaris: Secondary | ICD-10-CM | POA: Diagnosis not present

## 2015-04-05 DIAGNOSIS — S0030XA Unspecified superficial injury of nose, initial encounter: Secondary | ICD-10-CM | POA: Diagnosis not present

## 2015-04-11 ENCOUNTER — Encounter: Payer: Self-pay | Admitting: Physician Assistant

## 2015-04-12 DIAGNOSIS — Z6841 Body Mass Index (BMI) 40.0 and over, adult: Secondary | ICD-10-CM | POA: Diagnosis not present

## 2015-04-12 DIAGNOSIS — E782 Mixed hyperlipidemia: Secondary | ICD-10-CM | POA: Diagnosis not present

## 2015-04-19 ENCOUNTER — Other Ambulatory Visit: Payer: Self-pay | Admitting: Internal Medicine

## 2015-04-19 DIAGNOSIS — Z5181 Encounter for therapeutic drug level monitoring: Secondary | ICD-10-CM

## 2015-04-19 DIAGNOSIS — Z79899 Other long term (current) drug therapy: Secondary | ICD-10-CM

## 2015-04-19 DIAGNOSIS — I480 Paroxysmal atrial fibrillation: Secondary | ICD-10-CM

## 2015-04-20 ENCOUNTER — Ambulatory Visit (INDEPENDENT_AMBULATORY_CARE_PROVIDER_SITE_OTHER): Payer: 59

## 2015-04-20 ENCOUNTER — Encounter: Payer: 59 | Admitting: Physician Assistant

## 2015-04-20 DIAGNOSIS — Z5181 Encounter for therapeutic drug level monitoring: Secondary | ICD-10-CM

## 2015-04-20 DIAGNOSIS — I480 Paroxysmal atrial fibrillation: Secondary | ICD-10-CM

## 2015-04-20 DIAGNOSIS — Z79899 Other long term (current) drug therapy: Secondary | ICD-10-CM | POA: Diagnosis not present

## 2015-04-20 LAB — EXERCISE TOLERANCE TEST
CHL CUP MPHR: 164 {beats}/min
CHL CUP STRESS STAGE 1 DBP: 78 mmHg
CHL CUP STRESS STAGE 1 GRADE: 0 %
CHL CUP STRESS STAGE 1 HR: 76 {beats}/min
CHL CUP STRESS STAGE 1 SBP: 114 mmHg
CHL CUP STRESS STAGE 1 SPEED: 0 mph
CHL CUP STRESS STAGE 2 HR: 67 {beats}/min
CHL CUP STRESS STAGE 3 HR: 66 {beats}/min
CHL CUP STRESS STAGE 4 GRADE: 10 %
CHL CUP STRESS STAGE 4 SBP: 154 mmHg
CHL CUP STRESS STAGE 4 SPEED: 1.7 mph
CHL CUP STRESS STAGE 5 GRADE: 12 %
CHL CUP STRESS STAGE 5 HR: 126 {beats}/min
CHL CUP STRESS STAGE 6 HR: 141 {beats}/min
CHL CUP STRESS STAGE 6 SBP: 169 mmHg
CHL CUP STRESS STAGE 6 SPEED: 3.4 mph
CHL CUP STRESS STAGE 7 DBP: 85 mmHg
CHL CUP STRESS STAGE 7 GRADE: 0 %
CHL CUP STRESS STAGE 7 HR: 127 {beats}/min
CHL CUP STRESS STAGE 8 SBP: 132 mmHg
CSEPHR: 86 %
Estimated workload: 10.1 METS
Exercise duration (min): 8 min
Exercise duration (sec): 42 s
Peak BP: 169 mmHg
Peak HR: 141 {beats}/min
Percent of predicted max HR: 85 %
RPE: 17
Rest HR: 66 {beats}/min
Stage 2 Grade: 0 %
Stage 2 Speed: 1 mph
Stage 3 Grade: 0.1 %
Stage 3 Speed: 1 mph
Stage 4 DBP: 43 mmHg
Stage 4 HR: 106 {beats}/min
Stage 5 DBP: 60 mmHg
Stage 5 SBP: 160 mmHg
Stage 5 Speed: 2.5 mph
Stage 6 DBP: 93 mmHg
Stage 6 Grade: 14 %
Stage 7 SBP: 158 mmHg
Stage 7 Speed: 1.5 mph
Stage 8 DBP: 78 mmHg
Stage 8 Grade: 0 %
Stage 8 HR: 81 {beats}/min
Stage 8 Speed: 0 mph

## 2015-05-01 DIAGNOSIS — J340 Abscess, furuncle and carbuncle of nose: Secondary | ICD-10-CM | POA: Diagnosis not present

## 2015-05-01 DIAGNOSIS — J342 Deviated nasal septum: Secondary | ICD-10-CM | POA: Diagnosis not present

## 2015-05-08 DIAGNOSIS — Z96653 Presence of artificial knee joint, bilateral: Secondary | ICD-10-CM | POA: Diagnosis not present

## 2015-05-21 DIAGNOSIS — J340 Abscess, furuncle and carbuncle of nose: Secondary | ICD-10-CM | POA: Diagnosis not present

## 2015-06-29 DIAGNOSIS — Z6841 Body Mass Index (BMI) 40.0 and over, adult: Secondary | ICD-10-CM | POA: Diagnosis not present

## 2015-06-29 DIAGNOSIS — I471 Supraventricular tachycardia: Secondary | ICD-10-CM | POA: Diagnosis not present

## 2015-08-08 DIAGNOSIS — L403 Pustulosis palmaris et plantaris: Secondary | ICD-10-CM | POA: Diagnosis not present

## 2015-09-21 DIAGNOSIS — Z79899 Other long term (current) drug therapy: Secondary | ICD-10-CM | POA: Diagnosis not present

## 2015-09-21 DIAGNOSIS — E782 Mixed hyperlipidemia: Secondary | ICD-10-CM | POA: Diagnosis not present

## 2015-09-21 DIAGNOSIS — R7309 Other abnormal glucose: Secondary | ICD-10-CM | POA: Diagnosis not present

## 2015-10-15 ENCOUNTER — Encounter: Payer: Self-pay | Admitting: Internal Medicine

## 2015-10-26 ENCOUNTER — Encounter: Payer: Self-pay | Admitting: Internal Medicine

## 2015-10-26 ENCOUNTER — Ambulatory Visit (INDEPENDENT_AMBULATORY_CARE_PROVIDER_SITE_OTHER): Payer: 59 | Admitting: Internal Medicine

## 2015-10-26 VITALS — BP 124/62 | HR 78 | Ht 65.0 in | Wt 237.0 lb

## 2015-10-26 DIAGNOSIS — I4891 Unspecified atrial fibrillation: Secondary | ICD-10-CM | POA: Diagnosis not present

## 2015-10-26 NOTE — Progress Notes (Signed)
HPI [name] returns today for follow-up. She is a very pleasant 55 year old woman with a history of palpitations and paroxysmal atrial arrhythmias. When I saw the patient over a year ago, we recommended initiation of low-dose flecainide. Her symptoms are much improved. She admits taking her flecainide every morning, but does occasionally miss her evening dose.  She has frequent breakthroughs of atrial fib which will occur when she forgets her flecainide in the evening. No syncope.  Allergies  Allergen Reactions  . Ivp Dye [Iodinated Diagnostic Agents]     ALSO VICRYL CAUSES OPEN SORES/BLISTERS  . Meprobamate-Aspirin Other (See Comments)  . Other Other (See Comments)    Vicryl suture causes sores/blisters, narcotics make her sick  . Pneumococcal Vac Polyvalent Other (See Comments)    Fever, edema  . Pneumococcal Vaccine Other (See Comments)    Fever, edema     Current Outpatient Prescriptions  Medication Sig Dispense Refill  . aspirin EC 81 MG tablet Take 1 tablet (81 mg total) by mouth daily. 90 tablet 3  . Cholecalciferol (VITAMIN D3) 2000 UNITS TABS Take 1 tablet by mouth everyday except take 2 tablets by mouth on Sunday    . clotrimazole-betamethasone (LOTRISONE) cream APPLY A SMALL AMOUNT TO AFFECTED AREA 2 TIMES A DAY AS DIRECTED    . flecainide (TAMBOCOR) 100 MG tablet Take 1 tablet (100 mg total) by mouth 2 (two) times daily. 180 tablet 3  . ibuprofen (ADVIL,MOTRIN) 200 MG tablet Take 800 mg by mouth every 6 (six) hours as needed for moderate pain.    . Magnesium Oxide (MAG-OXIDE PO) Take 800 mg by mouth daily.    . NON FORMULARY Patient stated she is taking an old weight loss medication but can not recall the name of it.    . ranitidine (ZANTAC) 150 MG tablet Take 150 mg by mouth daily as needed for heartburn.    . rosuvastatin (CRESTOR) 10 MG tablet TAKE ONE TABLET BY MOUTH IN THE MORNING    . venlafaxine XR (EFFEXOR-XR) 150 MG 24 hr capsule Take 1 capsule by mouth  every morning.  3   No current facility-administered medications for this visit.      Past Medical History:  Diagnosis Date  . Arrhythmia   . Atrial fibrillation (Brianna French)   . Chest pain    NM Myocardial Scan 08/31/12   . Chickenpox   . Daytime somnolence   . Hyperlipemia   . Mixed hyperlipidemia   . Ovarian cyst   . Palpitations   . Plantar fasciitis of left foot   . PSVT (paroxysmal supraventricular tachycardia) (Brianna French)     ROS:   All systems reviewed and negative except as noted in the HPI.   Past Surgical History:  Procedure Laterality Date  . APPENDECTOMY  07-16-14   Dr. Marina French  . BILATERAL KNEE ARTHROSCOPY  05/06/10  . KNEE ARTHROSCOPY Right 2007  . LAPAROSCOPIC APPENDECTOMY N/A 07/16/2014   Procedure: APPENDECTOMY LAPAROSCOPIC;  Surgeon: Brianna Rad, MD;  Location: [facility] ORS;  Service: General;  Laterality: N/A;  . OVARIAN CYST SURGERY  1980  . REPLACEMENT TOTAL KNEE BILATERAL Bilateral   . TRIGGER FINGER RELEASE Right 06/06/11   Right ring finger     Family History  Problem Relation Age of Onset  . Heart attack Father   . Hypertension Mother   . Diabetes type II Mother   . Asthma Mother   . Mental illness Mother   . Hypertension Sister   . Diabetes  type II Sister   . Diabetes type II Other   . Cancer Other   . CAD      Fam Hx of CAD     Social History   Social History  . Marital status: Married    Spouse name: N/A  . Number of children: N/A  . Years of education: N/A   Occupational History  . Not on file.   Social History Main Topics  . Smoking status: Former Smoker    Types: Cigarettes    Quit date: 03/04/1979  . Smokeless tobacco: Never Used  . Alcohol use No  . Drug use: Unknown  . Sexual activity: Yes   Other Topics Concern  . Not on file   Social History Narrative  . No narrative on file     BP 124/62   Pulse 78   Ht 5\' 5"  (1.651 m)   Wt 237 lb (107.5 kg)   BMI 39.44 kg/m   Physical Exam:  Well appearing obese, middle-aged woman,  NAD HEENT: Unremarkable Neck:  6 cm JVD, no thyromegally Lymphatics:  No adenopathy Back:  No CVA tenderness Lungs:  Clear with no wheezes, rales, or rhonchi. HEART:  Regular rate rhythm, no murmurs, no rubs, no clicks Abd:  soft, positive bowel sounds, no organomegally, no rebound, no guarding Ext:  2 plus pulses, no edema, no cyanosis, no clubbing Skin:  No rashes no nodules Neuro:  CN II through XII intact, motor grossly intact  EKG Normal sinus rhythm. QRS - 118 ms  Assess/Plan: 1. PAF - she is mostly well controlled unless she forgets her meds. I have encouraged her to set an alarm as a reminder. 2. HTN - she is well controlled despite her weight. 3. Obesity - we discussed the importance of weight loss. 4. Sleep apnea -she snores at night. She is pending sleep study. I strongly encouraged her to have this evaluated.   Brianna French.D.

## 2015-10-26 NOTE — Patient Instructions (Signed)
Medication Instructions:  Your physician recommends that you continue on your current medications as directed. Please refer to the Current Medication list given to you today.   Labwork: None Ordered   Testing/Procedures: None Ordered   Follow-Up: Your physician wants you to follow-up in: 1 year with Dr. Taylor.  You will receive a reminder letter in the mail two months in advance. If you don't receive a letter, please call our office to schedule the follow-up appointment.   If you need a refill on your cardiac medications before your next appointment, please call your pharmacy.   Thank you for choosing [facility]! Eulah Walkup, RN [phone]    

## 2015-12-03 DIAGNOSIS — E782 Mixed hyperlipidemia: Secondary | ICD-10-CM | POA: Diagnosis not present

## 2015-12-03 DIAGNOSIS — R7309 Other abnormal glucose: Secondary | ICD-10-CM | POA: Diagnosis not present

## 2015-12-03 DIAGNOSIS — I471 Supraventricular tachycardia: Secondary | ICD-10-CM | POA: Diagnosis not present

## 2015-12-27 DIAGNOSIS — J343 Hypertrophy of nasal turbinates: Secondary | ICD-10-CM | POA: Diagnosis not present

## 2015-12-27 DIAGNOSIS — J340 Abscess, furuncle and carbuncle of nose: Secondary | ICD-10-CM | POA: Diagnosis not present

## 2015-12-31 ENCOUNTER — Other Ambulatory Visit
Admission: RE | Admit: 2015-12-31 | Discharge: 2015-12-31 | Disposition: A | Discharge status: Home / Self Care | Payer: 59 | Source: Ambulatory Visit | Attending: Internal Medicine | Admitting: Internal Medicine

## 2015-12-31 ENCOUNTER — Other Ambulatory Visit
Admission: RE | Admit: 2015-12-31 | Discharge: 2015-12-31 | Disposition: A | Discharge status: Home / Self Care | Payer: 59 | Source: Ambulatory Visit | Attending: Otolaryngology | Admitting: Otolaryngology

## 2015-12-31 DIAGNOSIS — Z79899 Other long term (current) drug therapy: Secondary | ICD-10-CM | POA: Diagnosis not present

## 2015-12-31 DIAGNOSIS — E782 Mixed hyperlipidemia: Secondary | ICD-10-CM | POA: Insufficient documentation

## 2015-12-31 DIAGNOSIS — J340 Abscess, furuncle and carbuncle of nose: Secondary | ICD-10-CM | POA: Diagnosis not present

## 2015-12-31 DIAGNOSIS — Z1329 Encounter for screening for other suspected endocrine disorder: Secondary | ICD-10-CM | POA: Insufficient documentation

## 2015-12-31 DIAGNOSIS — R7309 Other abnormal glucose: Secondary | ICD-10-CM | POA: Diagnosis not present

## 2015-12-31 LAB — URINALYSIS COMPLETE WITH MICROSCOPIC (ARMC ONLY)
Bacteria, UA: NONE SEEN
Bilirubin Urine: NEGATIVE
Glucose, UA: NEGATIVE mg/dL
KETONES UR: NEGATIVE mg/dL
LEUKOCYTES UA: NEGATIVE
Nitrite: NEGATIVE
PH: 6 (ref 5.0–8.0)
PROTEIN: NEGATIVE mg/dL
Specific Gravity, Urine: 1.011 (ref 1.005–1.030)

## 2015-12-31 LAB — COMPREHENSIVE METABOLIC PANEL
ALK PHOS: 74 U/L (ref 38–126)
ALT: 21 U/L (ref 14–54)
ANION GAP: 9 (ref 5–15)
AST: 25 U/L (ref 15–41)
Albumin: 3.9 g/dL (ref 3.5–5.0)
BILIRUBIN TOTAL: 0.4 mg/dL (ref 0.3–1.2)
BUN: 14 mg/dL (ref 6–20)
CALCIUM: 9.3 mg/dL (ref 8.9–10.3)
CO2: 24 mmol/L (ref 22–32)
Chloride: 104 mmol/L (ref 101–111)
Creatinine, Ser: 0.89 mg/dL (ref 0.44–1.00)
GFR calc non Af Amer: >60 mL/min (ref >60)
Glucose, Bld: 119 mg/dL — ABNORMAL HIGH (ref 65–99)
Potassium: 3.6 mmol/L (ref 3.5–5.1)
SODIUM: 137 mmol/L (ref 135–145)
TOTAL PROTEIN: 6.9 g/dL (ref 6.5–8.1)

## 2015-12-31 LAB — LIPID PANEL
Cholesterol: 194 mg/dL (ref 0–200)
HDL: 56 mg/dL (ref >40)
LDL CALC: 110 mg/dL — AB (ref 0–99)
TRIGLYCERIDES: 142 mg/dL (ref <150)
Total CHOL/HDL Ratio: 3.5 RATIO
VLDL: 28 mg/dL (ref 0–40)

## 2015-12-31 LAB — CBC WITH DIFFERENTIAL/PLATELET
BASOS ABS: 0 10*3/uL (ref 0–0.1)
BASOS PCT: 1 %
Eosinophils Absolute: 0.2 10*3/uL (ref 0–0.7)
Eosinophils Relative: 4 %
HEMATOCRIT: 39.8 % (ref 35.0–47.0)
HEMOGLOBIN: 13.7 g/dL (ref 12.0–16.0)
Lymphocytes Relative: 31 %
Lymphs Abs: 1.2 10*3/uL (ref 1.0–3.6)
MCH: 32.5 pg (ref 26.0–34.0)
MCHC: 34.3 g/dL (ref 32.0–36.0)
MCV: 94.7 fL (ref 80.0–100.0)
Monocytes Absolute: 0.6 10*3/uL (ref 0.2–0.9)
Monocytes Relative: 14 %
NEUTROS ABS: 2 10*3/uL (ref 1.4–6.5)
NEUTROS PCT: 50 %
Platelets: 210 10*3/uL (ref 150–440)
RBC: 4.21 MIL/uL (ref 3.80–5.20)
RDW: 12.9 % (ref 11.5–14.5)
WBC: 3.9 10*3/uL (ref 3.6–11.0)

## 2015-12-31 LAB — TSH: TSH: 3.589 u[IU]/mL (ref 0.350–4.500)

## 2015-12-31 LAB — C-REACTIVE PROTEIN: CRP: <0.8 mg/dL (ref <1.0)

## 2015-12-31 LAB — SEDIMENTATION RATE: Sed Rate: 17 mm/hr (ref 0–30)

## 2016-01-01 LAB — ANCA TITERS

## 2016-01-01 LAB — THYROID PANEL
FREE THYROXINE INDEX: 1.3 (ref 1.2–4.9)
T3 Uptake Ratio: 24 % (ref 24–39)
T4, Total: 5.3 ug/dL (ref 4.5–12.0)

## 2016-01-01 LAB — HEMOGLOBIN A1C
HEMOGLOBIN A1C: 5.8 % — AB (ref 4.8–5.6)
MEAN PLASMA GLUCOSE: 120 mg/dL

## 2016-01-01 LAB — RHEUMATOID FACTOR: RHEUMATOID FACTOR: 10 [IU]/mL (ref 0.0–13.9)

## 2016-03-18 DIAGNOSIS — I480 Paroxysmal atrial fibrillation: Secondary | ICD-10-CM | POA: Diagnosis not present

## 2016-03-18 DIAGNOSIS — R7309 Other abnormal glucose: Secondary | ICD-10-CM | POA: Diagnosis not present

## 2016-03-18 DIAGNOSIS — E782 Mixed hyperlipidemia: Secondary | ICD-10-CM | POA: Diagnosis not present

## 2016-04-14 DIAGNOSIS — H524 Presbyopia: Secondary | ICD-10-CM | POA: Diagnosis not present

## 2016-05-16 ENCOUNTER — Ambulatory Visit: Payer: 59 | Attending: Internal Medicine

## 2016-05-16 DIAGNOSIS — G4761 Periodic limb movement disorder: Secondary | ICD-10-CM | POA: Insufficient documentation

## 2016-05-16 DIAGNOSIS — G4733 Obstructive sleep apnea (adult) (pediatric): Secondary | ICD-10-CM | POA: Insufficient documentation

## 2016-06-23 ENCOUNTER — Other Ambulatory Visit: Payer: Self-pay | Admitting: Internal Medicine

## 2016-06-23 DIAGNOSIS — R7309 Other abnormal glucose: Secondary | ICD-10-CM | POA: Diagnosis not present

## 2016-06-23 DIAGNOSIS — I480 Paroxysmal atrial fibrillation: Secondary | ICD-10-CM | POA: Diagnosis not present

## 2016-06-23 DIAGNOSIS — Z79899 Other long term (current) drug therapy: Secondary | ICD-10-CM | POA: Diagnosis not present

## 2016-06-23 DIAGNOSIS — E782 Mixed hyperlipidemia: Secondary | ICD-10-CM | POA: Diagnosis not present

## 2016-07-16 ENCOUNTER — Encounter: Payer: Self-pay | Admitting: Physician Assistant

## 2016-07-16 ENCOUNTER — Ambulatory Visit: Payer: Self-pay | Admitting: Physician Assistant

## 2016-07-16 VITALS — BP 120/72 | HR 67 | Temp 98.1°F

## 2016-07-16 DIAGNOSIS — R197 Diarrhea, unspecified: Secondary | ICD-10-CM

## 2016-07-16 NOTE — Progress Notes (Signed)
S:  Pt c/o  diarrhea, sx for 8 days, no fever/chills, no abd pain except for cramping with diarrhea; denies cp/sob, denies camping, bad food, recent antibiotics, or exposure to bad water, states she actually feels a little better today, hasn't taken immodium or any other medication, is eating a lot of fruit but has been doing that since February Remainder ros neg  O:  Vitals wnl, nad, ENT wnl, neck supple no lymph, lungs c t a, cv rrr, abd soft nontender bs normal all 4 quads, neuro intact  A:  Viral diarrhea  P:  Reassurance, fluids, brat diet, immodium ad for diarrhea if needed, return if not better in 3 days, return earlier if worsening, if not better by Monday will order stool cultures, take otc probiotics

## 2016-10-03 ENCOUNTER — Ambulatory Visit: Payer: 59 | Attending: Specialist

## 2016-10-03 DIAGNOSIS — G4733 Obstructive sleep apnea (adult) (pediatric): Secondary | ICD-10-CM | POA: Diagnosis not present

## 2016-10-06 DIAGNOSIS — R079 Chest pain, unspecified: Secondary | ICD-10-CM | POA: Diagnosis not present

## 2016-10-06 DIAGNOSIS — M542 Cervicalgia: Secondary | ICD-10-CM | POA: Diagnosis not present

## 2016-10-09 DIAGNOSIS — M542 Cervicalgia: Secondary | ICD-10-CM | POA: Diagnosis not present

## 2016-12-15 ENCOUNTER — Other Ambulatory Visit
Admission: RE | Admit: 2016-12-15 | Discharge: 2016-12-15 | Disposition: A | Discharge status: Home / Self Care | Payer: 59 | Source: Ambulatory Visit | Attending: Internal Medicine | Admitting: Internal Medicine

## 2016-12-15 DIAGNOSIS — R7309 Other abnormal glucose: Secondary | ICD-10-CM | POA: Diagnosis not present

## 2016-12-15 DIAGNOSIS — Z79899 Other long term (current) drug therapy: Secondary | ICD-10-CM | POA: Insufficient documentation

## 2016-12-15 DIAGNOSIS — E782 Mixed hyperlipidemia: Secondary | ICD-10-CM | POA: Diagnosis not present

## 2016-12-15 DIAGNOSIS — I480 Paroxysmal atrial fibrillation: Secondary | ICD-10-CM | POA: Diagnosis not present

## 2016-12-15 DIAGNOSIS — Z0000 Encounter for general adult medical examination without abnormal findings: Secondary | ICD-10-CM | POA: Diagnosis not present

## 2016-12-15 LAB — CBC WITH DIFFERENTIAL/PLATELET
BASOS ABS: 0 10*3/uL (ref 0–0.1)
BASOS PCT: 1 %
Eosinophils Absolute: 0.1 10*3/uL (ref 0–0.7)
Eosinophils Relative: 3 %
HEMATOCRIT: 42.6 % (ref 35.0–47.0)
HEMOGLOBIN: 14.3 g/dL (ref 12.0–16.0)
LYMPHS PCT: 30 %
Lymphs Abs: 1.3 10*3/uL (ref 1.0–3.6)
MCH: 32.2 pg (ref 26.0–34.0)
MCHC: 33.5 g/dL (ref 32.0–36.0)
MCV: 96.2 fL (ref 80.0–100.0)
MONO ABS: 0.6 10*3/uL (ref 0.2–0.9)
Monocytes Relative: 14 %
NEUTROS ABS: 2.3 10*3/uL (ref 1.4–6.5)
NEUTROS PCT: 52 %
Platelets: 229 10*3/uL (ref 150–440)
RBC: 4.43 MIL/uL (ref 3.80–5.20)
RDW: 12.8 % (ref 11.5–14.5)
WBC: 4.4 10*3/uL (ref 3.6–11.0)

## 2016-12-15 LAB — COMPREHENSIVE METABOLIC PANEL
ALK PHOS: 72 U/L (ref 38–126)
ALT: 23 U/L (ref 14–54)
ANION GAP: 8 (ref 5–15)
AST: 25 U/L (ref 15–41)
Albumin: 4 g/dL (ref 3.5–5.0)
BILIRUBIN TOTAL: 0.6 mg/dL (ref 0.3–1.2)
BUN: 19 mg/dL (ref 6–20)
CALCIUM: 9.4 mg/dL (ref 8.9–10.3)
CO2: 28 mmol/L (ref 22–32)
Chloride: 104 mmol/L (ref 101–111)
Creatinine, Ser: 0.78 mg/dL (ref 0.44–1.00)
Glucose, Bld: 103 mg/dL — ABNORMAL HIGH (ref 65–99)
POTASSIUM: 4.2 mmol/L (ref 3.5–5.1)
Sodium: 140 mmol/L (ref 135–145)
TOTAL PROTEIN: 7.4 g/dL (ref 6.5–8.1)

## 2016-12-15 LAB — URINALYSIS, COMPLETE (UACMP) WITH MICROSCOPIC
BILIRUBIN URINE: NEGATIVE
Bacteria, UA: NONE SEEN
GLUCOSE, UA: NEGATIVE mg/dL
HGB URINE DIPSTICK: NEGATIVE
KETONES UR: NEGATIVE mg/dL
LEUKOCYTES UA: NEGATIVE
NITRITE: NEGATIVE
PROTEIN: NEGATIVE mg/dL
Specific Gravity, Urine: 1.018 (ref 1.005–1.030)
pH: 7 (ref 5.0–8.0)

## 2016-12-15 LAB — LIPID PANEL
CHOL/HDL RATIO: 3 ratio
Cholesterol: 207 mg/dL — ABNORMAL HIGH (ref 0–200)
HDL: 69 mg/dL (ref >40)
LDL CALC: 120 mg/dL — AB (ref 0–99)
TRIGLYCERIDES: 90 mg/dL (ref <150)
VLDL: 18 mg/dL (ref 0–40)

## 2016-12-15 LAB — HEMOGLOBIN A1C
Hgb A1c MFr Bld: 5.8 % — ABNORMAL HIGH (ref 4.8–5.6)
Mean Plasma Glucose: 119.76 mg/dL

## 2016-12-16 LAB — THYROID PANEL
Free Thyroxine Index: 1.4 (ref 1.2–4.9)
T3 UPTAKE RATIO: 24 % (ref 24–39)
T4, Total: 5.8 ug/dL (ref 4.5–12.0)

## 2016-12-26 ENCOUNTER — Ambulatory Visit (INDEPENDENT_AMBULATORY_CARE_PROVIDER_SITE_OTHER): Payer: 59 | Admitting: Internal Medicine

## 2016-12-26 ENCOUNTER — Encounter: Payer: Self-pay | Admitting: Internal Medicine

## 2016-12-26 ENCOUNTER — Encounter (INDEPENDENT_AMBULATORY_CARE_PROVIDER_SITE_OTHER): Payer: Self-pay

## 2016-12-26 VITALS — BP 126/82 | HR 80 | Resp 16 | Ht 66.0 in | Wt 229.4 lb

## 2016-12-26 DIAGNOSIS — R002 Palpitations: Secondary | ICD-10-CM | POA: Diagnosis not present

## 2016-12-26 DIAGNOSIS — I480 Paroxysmal atrial fibrillation: Secondary | ICD-10-CM | POA: Diagnosis not present

## 2016-12-26 NOTE — Patient Instructions (Signed)

## 2016-12-26 NOTE — Progress Notes (Signed)
HPI Brianna French returns today for ongoing evaluation and management of palpitations and paroxysmal arrhythmias. She is a very pleasant 56 year old woman with a history of palpitations and atrial fibrillation. She has been treated in the past with flecainide. In the interim, she has done well with her palpitations being well controlled. She has struggle to lose weight.  Allergies  Allergen Reactions  . Ivp Dye [Iodinated Diagnostic Agents]     ALSO VICRYL CAUSES OPEN SORES/BLISTERS  . Other Other (See Comments)    Vicryl suture causes sores/blisters, narcotics make her sick  . Pneumococcal Vac Polyvalent Other (See Comments)    Fever, edema  . Pneumococcal Vaccine Other (See Comments)    Fever, edema     Current Outpatient Prescriptions  Medication Sig Dispense Refill  . aspirin EC 81 MG tablet Take 1 tablet (81 mg total) by mouth daily. 90 tablet 3  . Cholecalciferol (VITAMIN D3) 2000 UNITS TABS Take 1 tablet by mouth everyday except take 2 tablets by mouth on Sunday    . clotrimazole-betamethasone (LOTRISONE) cream APPLY A SMALL AMOUNT TO AFFECTED AREA 2 TIMES A DAY AS DIRECTED    . Cyanocobalamin (B-12) 3000 MCG CAPS Take by mouth.    . flecainide (TAMBOCOR) 100 MG tablet TAKE 1 TABLET BY MOUTH 2 TIMES DAILY. 180 tablet 0  . ibuprofen (ADVIL,MOTRIN) 200 MG tablet Take 800 mg by mouth every 6 (six) hours as needed for moderate pain.    . Magnesium Oxide (MAG-OXIDE PO) Take 800 mg by mouth daily.    . ranitidine (ZANTAC) 150 MG tablet Take 150 mg by mouth daily as needed for heartburn.    . rosuvastatin (CRESTOR) 10 MG tablet TAKE ONE TABLET BY MOUTH IN THE MORNING    . venlafaxine XR (EFFEXOR-XR) 150 MG 24 hr capsule Take 1 capsule by mouth every morning.  3   No current facility-administered medications for this visit.      Past Medical History:  Diagnosis Date  . Arrhythmia   . Atrial fibrillation ([place])   . Chest pain    NM Myocardial Scan 08/31/12   . Chickenpox     . Daytime somnolence   . Hyperlipemia   . Mixed hyperlipidemia   . Ovarian cyst   . Palpitations   . Plantar fasciitis of left foot   . PSVT (paroxysmal supraventricular tachycardia) ([facility])     ROS:   All systems reviewed and negative except as noted in the HPI.   Past Surgical History:  Procedure Laterality Date  . APPENDECTOMY  07-16-14   Dr. Marina Gravel  . BILATERAL KNEE ARTHROSCOPY  05/06/10  . KNEE ARTHROSCOPY Right 2007  . LAPAROSCOPIC APPENDECTOMY N/A 07/16/2014   Procedure: APPENDECTOMY LAPAROSCOPIC;  Surgeon: Sherri Rad, MD;  Location: [facility] ORS;  Service: General;  Laterality: N/A;  . OVARIAN CYST SURGERY  1980  . REPLACEMENT TOTAL KNEE BILATERAL Bilateral   . TRIGGER FINGER RELEASE Right 06/06/11   Right ring finger     Family History  Problem Relation Age of Onset  . Heart attack Father   . Hypertension Mother   . Diabetes type II Mother   . Asthma Mother   . Mental illness Mother   . Hypertension Sister   . Diabetes type II Sister   . Diabetes type II Other   . Cancer Other   . CAD Unknown        Fam Hx of CAD     Social History   Social History  .  Marital status: Married    Spouse name: N/A  . Number of children: N/A  . Years of education: N/A   Occupational History  . Not on file.   Social History Main Topics  . Smoking status: Former Smoker    Types: Cigarettes    Quit date: 03/04/1979  . Smokeless tobacco: Never Used  . Alcohol use No  . Drug use: Unknown  . Sexual activity: Yes   Other Topics Concern  . Not on file   Social History Narrative  . No narrative on file     BP 126/82   Pulse 80   Resp 16   Ht 5\' 6"  (1.676 m)   Wt 229 lb 6.4 oz (104.1 kg)   SpO2 98%   BMI 37.03 kg/m   Physical Exam:  stable appearing NAD HEENT: Unremarkable Neck:  6 cm JVD, no thyromegally Lymphatics:  No adenopathy Back:  No CVA tenderness Lungs:  Clear with no wheezes HEART:  Regular rate rhythm, no murmurs, no rubs, no clicks Abd:  soft,  positive bowel sounds, no organomegally, no rebound, no guarding Ext:  2 plus pulses, no edema, no cyanosis, no clubbing Skin:  No rashes no nodules Neuro:  CN II through XII intact, motor grossly intact  EKG - nsr , QRS 116  Assess/Plan: 1. PAF - she is maintaining NSR. She will continue her flecainide. 2. Obesity - she has struggle to lose weight but amazingly does not have either HTN or DM. She is strongly encouraged to lose weight. 3. Dyslipidemia - she will continue her statin therapy. No change in her medications.  Cristopher Peru, M.D.

## 2016-12-29 NOTE — Addendum Note (Signed)
Addended by: Valere Dross on: 12/29/2016 09:58 AM   Modules accepted: Orders

## 2017-01-12 DIAGNOSIS — G4733 Obstructive sleep apnea (adult) (pediatric): Secondary | ICD-10-CM | POA: Diagnosis not present

## 2017-02-11 DIAGNOSIS — G4733 Obstructive sleep apnea (adult) (pediatric): Secondary | ICD-10-CM | POA: Diagnosis not present

## 2017-03-11 ENCOUNTER — Other Ambulatory Visit: Payer: Self-pay | Admitting: Internal Medicine

## 2017-03-11 DIAGNOSIS — Z1231 Encounter for screening mammogram for malignant neoplasm of breast: Secondary | ICD-10-CM

## 2017-03-14 DIAGNOSIS — G4733 Obstructive sleep apnea (adult) (pediatric): Secondary | ICD-10-CM | POA: Diagnosis not present

## 2017-03-25 ENCOUNTER — Ambulatory Visit
Admission: RE | Admit: 2017-03-25 | Discharge: 2017-03-25 | Disposition: A | Discharge status: Home / Self Care | Payer: 59 | Source: Ambulatory Visit | Attending: Internal Medicine | Admitting: Internal Medicine

## 2017-03-25 DIAGNOSIS — Z1231 Encounter for screening mammogram for malignant neoplasm of breast: Secondary | ICD-10-CM | POA: Diagnosis not present

## 2017-03-25 DIAGNOSIS — Z1211 Encounter for screening for malignant neoplasm of colon: Secondary | ICD-10-CM | POA: Diagnosis not present

## 2017-03-25 DIAGNOSIS — Z01419 Encounter for gynecological examination (general) (routine) without abnormal findings: Secondary | ICD-10-CM | POA: Diagnosis not present

## 2017-03-30 ENCOUNTER — Other Ambulatory Visit: Payer: Self-pay | Admitting: *Deleted

## 2017-03-30 ENCOUNTER — Inpatient Hospital Stay
Admission: RE | Admit: 2017-03-30 | Discharge: 2017-03-30 | Disposition: A | Discharge status: Home / Self Care | Payer: Self-pay | Source: Ambulatory Visit | Attending: *Deleted | Admitting: *Deleted

## 2017-03-30 DIAGNOSIS — Z9289 Personal history of other medical treatment: Secondary | ICD-10-CM

## 2017-04-14 DIAGNOSIS — G4733 Obstructive sleep apnea (adult) (pediatric): Secondary | ICD-10-CM | POA: Diagnosis not present

## 2017-04-22 DIAGNOSIS — G4733 Obstructive sleep apnea (adult) (pediatric): Secondary | ICD-10-CM | POA: Diagnosis not present

## 2017-05-12 DIAGNOSIS — G4733 Obstructive sleep apnea (adult) (pediatric): Secondary | ICD-10-CM | POA: Diagnosis not present

## 2017-06-12 DIAGNOSIS — G4733 Obstructive sleep apnea (adult) (pediatric): Secondary | ICD-10-CM | POA: Diagnosis not present

## 2017-06-15 DIAGNOSIS — R7309 Other abnormal glucose: Secondary | ICD-10-CM | POA: Diagnosis not present

## 2017-06-15 DIAGNOSIS — I480 Paroxysmal atrial fibrillation: Secondary | ICD-10-CM | POA: Diagnosis not present

## 2017-06-15 DIAGNOSIS — E782 Mixed hyperlipidemia: Secondary | ICD-10-CM | POA: Diagnosis not present

## 2017-06-19 ENCOUNTER — Other Ambulatory Visit
Admission: RE | Admit: 2017-06-19 | Discharge: 2017-06-19 | Disposition: A | Discharge status: Home / Self Care | Payer: 59 | Source: Ambulatory Visit | Attending: Internal Medicine | Admitting: Internal Medicine

## 2017-06-19 DIAGNOSIS — Z79899 Other long term (current) drug therapy: Secondary | ICD-10-CM | POA: Insufficient documentation

## 2017-06-19 DIAGNOSIS — E782 Mixed hyperlipidemia: Secondary | ICD-10-CM | POA: Diagnosis not present

## 2017-06-19 LAB — CBC WITH DIFFERENTIAL/PLATELET
Basophils Absolute: 0 10*3/uL (ref 0–0.1)
Basophils Relative: 1 %
EOS ABS: 0.3 10*3/uL (ref 0–0.7)
Eosinophils Relative: 6 %
HEMATOCRIT: 39.9 % (ref 35.0–47.0)
HEMOGLOBIN: 13.6 g/dL (ref 12.0–16.0)
LYMPHS ABS: 1.3 10*3/uL (ref 1.0–3.6)
Lymphocytes Relative: 25 %
MCH: 32.4 pg (ref 26.0–34.0)
MCHC: 34.1 g/dL (ref 32.0–36.0)
MCV: 95.2 fL (ref 80.0–100.0)
MONO ABS: 0.9 10*3/uL (ref 0.2–0.9)
MONOS PCT: 18 %
NEUTROS ABS: 2.6 10*3/uL (ref 1.4–6.5)
Neutrophils Relative %: 50 %
Platelets: 212 10*3/uL (ref 150–440)
RBC: 4.19 MIL/uL (ref 3.80–5.20)
RDW: 13.3 % (ref 11.5–14.5)
WBC: 5.1 10*3/uL (ref 3.6–11.0)

## 2017-06-19 LAB — LIPID PANEL
Cholesterol: 181 mg/dL (ref 0–200)
HDL: 63 mg/dL (ref >40)
LDL CALC: 101 mg/dL — AB (ref 0–99)
Total CHOL/HDL Ratio: 2.9 RATIO
Triglycerides: 86 mg/dL (ref <150)
VLDL: 17 mg/dL (ref 0–40)

## 2017-06-19 LAB — COMPREHENSIVE METABOLIC PANEL
ALBUMIN: 3.8 g/dL (ref 3.5–5.0)
ALK PHOS: 74 U/L (ref 38–126)
ALT: 22 U/L (ref 14–54)
AST: 26 U/L (ref 15–41)
Anion gap: 7 (ref 5–15)
BUN: 20 mg/dL (ref 6–20)
CALCIUM: 9 mg/dL (ref 8.9–10.3)
CHLORIDE: 108 mmol/L (ref 101–111)
CO2: 24 mmol/L (ref 22–32)
CREATININE: 0.72 mg/dL (ref 0.44–1.00)
GFR calc Af Amer: >60 mL/min (ref >60)
GFR calc non Af Amer: >60 mL/min (ref >60)
GLUCOSE: 120 mg/dL — AB (ref 65–99)
Potassium: 4 mmol/L (ref 3.5–5.1)
Sodium: 139 mmol/L (ref 135–145)
Total Bilirubin: 0.4 mg/dL (ref 0.3–1.2)
Total Protein: 7 g/dL (ref 6.5–8.1)

## 2017-06-19 LAB — HEMOGLOBIN A1C
Hgb A1c MFr Bld: 5.7 % — ABNORMAL HIGH (ref 4.8–5.6)
MEAN PLASMA GLUCOSE: 116.89 mg/dL

## 2017-07-12 DIAGNOSIS — G4733 Obstructive sleep apnea (adult) (pediatric): Secondary | ICD-10-CM | POA: Diagnosis not present

## 2017-07-16 DIAGNOSIS — G4733 Obstructive sleep apnea (adult) (pediatric): Secondary | ICD-10-CM | POA: Diagnosis not present

## 2017-09-23 DIAGNOSIS — M5412 Radiculopathy, cervical region: Secondary | ICD-10-CM | POA: Diagnosis not present

## 2017-09-23 DIAGNOSIS — M4802 Spinal stenosis, cervical region: Secondary | ICD-10-CM | POA: Diagnosis not present

## 2017-10-13 DIAGNOSIS — N950 Postmenopausal bleeding: Secondary | ICD-10-CM | POA: Diagnosis not present

## 2017-10-16 ENCOUNTER — Other Ambulatory Visit: Payer: Self-pay | Admitting: Physical Medicine and Rehabilitation

## 2017-10-16 DIAGNOSIS — M5416 Radiculopathy, lumbar region: Secondary | ICD-10-CM | POA: Diagnosis not present

## 2017-10-16 DIAGNOSIS — M5412 Radiculopathy, cervical region: Secondary | ICD-10-CM | POA: Diagnosis not present

## 2017-10-16 DIAGNOSIS — N950 Postmenopausal bleeding: Secondary | ICD-10-CM | POA: Diagnosis not present

## 2017-10-16 DIAGNOSIS — M5030 Other cervical disc degeneration, unspecified cervical region: Secondary | ICD-10-CM | POA: Diagnosis not present

## 2017-10-28 ENCOUNTER — Ambulatory Visit
Admission: RE | Admit: 2017-10-28 | Discharge: 2017-10-28 | Disposition: A | Discharge status: Home / Self Care | Payer: 59 | Source: Ambulatory Visit | Attending: Physical Medicine and Rehabilitation | Admitting: Physical Medicine and Rehabilitation

## 2017-10-28 DIAGNOSIS — M5011 Cervical disc disorder with radiculopathy,  high cervical region: Secondary | ICD-10-CM | POA: Diagnosis not present

## 2017-10-28 DIAGNOSIS — M4722 Other spondylosis with radiculopathy, cervical region: Secondary | ICD-10-CM | POA: Diagnosis not present

## 2017-10-28 DIAGNOSIS — M542 Cervicalgia: Secondary | ICD-10-CM | POA: Diagnosis not present

## 2017-10-28 DIAGNOSIS — M5412 Radiculopathy, cervical region: Secondary | ICD-10-CM | POA: Diagnosis present

## 2017-11-11 DIAGNOSIS — H524 Presbyopia: Secondary | ICD-10-CM | POA: Diagnosis not present

## 2017-12-01 HISTORY — PX: HYSTEROSCOPY: SHX211

## 2017-12-04 DIAGNOSIS — K219 Gastro-esophageal reflux disease without esophagitis: Secondary | ICD-10-CM | POA: Diagnosis not present

## 2017-12-04 DIAGNOSIS — M5030 Other cervical disc degeneration, unspecified cervical region: Secondary | ICD-10-CM | POA: Diagnosis not present

## 2017-12-04 DIAGNOSIS — M130 Polyarthritis, unspecified: Secondary | ICD-10-CM | POA: Diagnosis not present

## 2017-12-04 DIAGNOSIS — Z8679 Personal history of other diseases of the circulatory system: Secondary | ICD-10-CM | POA: Diagnosis not present

## 2017-12-04 DIAGNOSIS — E278 Other specified disorders of adrenal gland: Secondary | ICD-10-CM | POA: Diagnosis not present

## 2017-12-04 DIAGNOSIS — G4730 Sleep apnea, unspecified: Secondary | ICD-10-CM | POA: Diagnosis not present

## 2017-12-04 DIAGNOSIS — R7303 Prediabetes: Secondary | ICD-10-CM | POA: Diagnosis not present

## 2017-12-07 ENCOUNTER — Other Ambulatory Visit
Admission: RE | Admit: 2017-12-07 | Discharge: 2017-12-07 | Disposition: A | Discharge status: Home / Self Care | Payer: 59 | Source: Ambulatory Visit | Attending: General Surgery | Admitting: General Surgery

## 2017-12-07 DIAGNOSIS — K5900 Constipation, unspecified: Secondary | ICD-10-CM | POA: Insufficient documentation

## 2017-12-07 DIAGNOSIS — R5383 Other fatigue: Secondary | ICD-10-CM | POA: Diagnosis not present

## 2017-12-07 LAB — VITAMIN B12: VITAMIN B 12: 926 pg/mL — AB (ref 180–914)

## 2017-12-07 LAB — TSH: TSH: 2.99 u[IU]/mL (ref 0.350–4.500)

## 2017-12-08 DIAGNOSIS — M5030 Other cervical disc degeneration, unspecified cervical region: Secondary | ICD-10-CM | POA: Diagnosis not present

## 2017-12-08 DIAGNOSIS — M5412 Radiculopathy, cervical region: Secondary | ICD-10-CM | POA: Diagnosis not present

## 2017-12-08 LAB — CALCITRIOL (1,25 DI-OH VIT D): Vit D, 1,25-Dihydroxy: 41.5 pg/mL (ref 19.9–79.3)

## 2017-12-08 LAB — VITAMIN D 25 HYDROXY (VIT D DEFICIENCY, FRACTURES): VIT D 25 HYDROXY: 33 ng/mL (ref 30.0–100.0)

## 2017-12-09 ENCOUNTER — Telehealth: Payer: Self-pay | Admitting: *Deleted

## 2017-12-09 NOTE — Telephone Encounter (Signed)
   [facility] Pre-operative Risk Assessment    Request for surgical clearance:  1. What type of surgery is being performed? BARIATRIC SURGERY   2. When is this surgery scheduled?  TBD    3. Are there any medications that need to be held prior to surgery and how long?ASA   4. Practice name and name of physician performing surgery? [facility]; DR. Redmond Pulling   5. What is your office phone and fax number? PH# [phone]; FAX# [phone]   3. Anesthesia type (None, local, MAC, general) ? GENERAL    Julaine Hua 12/09/2017, 3:03 PM  _________________________________________________________________   (provider comments below)

## 2017-12-10 ENCOUNTER — Other Ambulatory Visit: Payer: Self-pay | Admitting: General Surgery

## 2017-12-10 DIAGNOSIS — E278 Other specified disorders of adrenal gland: Secondary | ICD-10-CM

## 2017-12-11 ENCOUNTER — Encounter: Payer: Self-pay | Admitting: Cardiology

## 2017-12-11 ENCOUNTER — Other Ambulatory Visit (HOSPITAL_COMMUNITY): Payer: Self-pay | Admitting: General Surgery

## 2017-12-11 DIAGNOSIS — K219 Gastro-esophageal reflux disease without esophagitis: Secondary | ICD-10-CM

## 2017-12-14 ENCOUNTER — Encounter: Payer: Self-pay | Admitting: Cardiology

## 2017-12-14 ENCOUNTER — Other Ambulatory Visit (HOSPITAL_COMMUNITY): Payer: Self-pay | Admitting: General Surgery

## 2017-12-14 ENCOUNTER — Ambulatory Visit: Payer: 59 | Admitting: Cardiology

## 2017-12-14 VITALS — BP 106/76 | HR 77 | Ht 66.0 in | Wt 240.8 lb

## 2017-12-14 DIAGNOSIS — G4733 Obstructive sleep apnea (adult) (pediatric): Secondary | ICD-10-CM | POA: Diagnosis not present

## 2017-12-14 DIAGNOSIS — E785 Hyperlipidemia, unspecified: Secondary | ICD-10-CM | POA: Diagnosis not present

## 2017-12-14 DIAGNOSIS — I480 Paroxysmal atrial fibrillation: Secondary | ICD-10-CM | POA: Diagnosis not present

## 2017-12-14 DIAGNOSIS — E278 Other specified disorders of adrenal gland: Secondary | ICD-10-CM

## 2017-12-14 NOTE — Progress Notes (Signed)
Cardiology Office Note:    Date:  12/14/2017   ID:  Brianna French, DOB February 10, 1961, MRN [id number]  PCP:  Idelle Crouch, MD  Cardiologist:  Cristopher Peru, MD  Referring MD: Idelle Crouch, MD   Chief Complaint  Patient presents with  . Pre-op Exam    History of Present Illness:    Brianna French is a 57 y.o. female with a past medical history significant for paroxysmal atrial fibrillation on flecainide, obesity, dyslipidemia, OSA on CPAP.  Surgical clearance has been requested for bariatric surgery by Dr. Redmond Pulling at [facility].  She denies chest discomfort. She does have mild DOE with walking up stairs that she attributes to her weight. She is having only brief episodes of fast heart beat that does not last over 5 minutes, most often at the end of a tiring day when the next dose of Flecainide is due. No sustained tachyarrhythmias. No orthopnea, PND, lightheadedness. She is having neck pain due to cervical spondylosis. She had an injection last week and is not much better pain wise. She is looking forward to weight loss surgery.   She was told that if she did not lose wt by her 58th birthday she would need to be on coumadin and she really does not want to be on coumadin.   Past Medical History:  Diagnosis Date  . Arrhythmia   . Atrial fibrillation ([place])   . Chest pain    NM Myocardial Scan 08/31/12   . Chickenpox   . Daytime somnolence   . Hyperlipemia   . Mixed hyperlipidemia   . Ovarian cyst   . Palpitations   . Plantar fasciitis of left foot   . PSVT (paroxysmal supraventricular tachycardia) ([place])     Past Surgical History:  Procedure Laterality Date  . APPENDECTOMY  07-16-14   Dr. Marina Gravel  . BILATERAL KNEE ARTHROSCOPY  05/06/10  . KNEE ARTHROSCOPY Right 2007  . LAPAROSCOPIC APPENDECTOMY N/A 07/16/2014   Procedure: APPENDECTOMY LAPAROSCOPIC;  Surgeon: Sherri Rad, MD;  Location: [facility] ORS;  Service: General;  Laterality: N/A;  . OVARIAN CYST SURGERY   1980  . REPLACEMENT TOTAL KNEE BILATERAL Bilateral   . TRIGGER FINGER RELEASE Right 06/06/11   Right ring finger    Current Medications: Current Meds  Medication Sig  . aspirin EC 81 MG tablet Take 1 tablet (81 mg total) by mouth daily.  . Cholecalciferol (VITAMIN D3) 2000 UNITS TABS Take 1 tablet by mouth everyday except take 2 tablets by mouth on Sunday  . clotrimazole-betamethasone (LOTRISONE) cream APPLY A SMALL AMOUNT TO AFFECTED AREA 2 TIMES A DAY AS DIRECTED  . Cyanocobalamin (B-12) 3000 MCG CAPS Take by mouth.  . flecainide (TAMBOCOR) 100 MG tablet TAKE 1 TABLET BY MOUTH 2 TIMES DAILY.  Marland Kitchen ibuprofen (ADVIL,MOTRIN) 200 MG tablet Take 800 mg by mouth every 6 (six) hours as needed for moderate pain.  . Magnesium Oxide (MAG-OXIDE PO) Take 800 mg by mouth daily.  . ranitidine (ZANTAC) 150 MG tablet Take 150 mg by mouth daily as needed for heartburn.  . rosuvastatin (CRESTOR) 10 MG tablet TAKE ONE TABLET BY MOUTH IN THE MORNING  . venlafaxine XR (EFFEXOR-XR) 150 MG 24 hr capsule Take 1 capsule by mouth every morning.     Allergies:   Ivp dye [iodinated diagnostic agents]; Other; Pneumococcal vac polyvalent; and Pneumococcal vaccine   Social History   Socioeconomic History  . Marital status: Married    Spouse name: Not on file  .  Number of children: Not on file  . Years of education: Not on file  . Highest education level: Not on file  Occupational History  . Not on file  Social Needs  . Financial resource strain: Not on file  . Food insecurity:    Worry: Not on file    Inability: Not on file  . Transportation needs:    Medical: Not on file    Non-medical: Not on file  Tobacco Use  . Smoking status: Former Smoker    Types: Cigarettes    Last attempt to quit: 03/04/1979    Years since quitting: 38.8  . Smokeless tobacco: Never Used  Substance and Sexual Activity  . Alcohol use: No    Alcohol/week: 0.0 standard drinks  . Drug use: Never  . Sexual activity: Yes  Lifestyle   . Physical activity:    Days per week: Not on file    Minutes per session: Not on file  . Stress: Not on file  Relationships  . Social connections:    Talks on phone: Not on file    Gets together: Not on file    Attends religious service: Not on file    Active member of club or organization: Not on file    Attends meetings of clubs or organizations: Not on file    Relationship status: Not on file  Other Topics Concern  . Not on file  Social History Narrative  . Not on file     Family History: The patient's family history includes Asthma in her mother; CAD in her unknown relative; Cancer in her other; Diabetes type II in her mother, other, and sister; Heart attack in her father; Hypertension in her mother and sister; Mental illness in her mother. ROS:   Please see the history of present illness.     All other systems reviewed and are negative.  EKGs/Labs/Other Studies Reviewed:    The following studies were reviewed today:  ETT 04/20/15 Study Highlights    There was no ST segment deviation noted during stress.  Negative ETT for ischemia at adequate HR response and workload of 10.1 mets. No chest pain noted.  Normal BP response to exercise.     EKG:  EKG is ordered today.  The ekg ordered today demonstrates NSR, 77 bpm. QTC 459  Recent Labs: 06/19/2017: ALT 22; BUN 20; Creatinine, Ser 0.72; Hemoglobin 13.6; Platelets 212; Potassium 4.0; Sodium 139 12/07/2017: TSH 2.990   Recent Lipid Panel    Component Value Date/Time   CHOL 181 06/19/2017 0710   CHOL 196 11/28/2013 1751   TRIG 86 06/19/2017 0710   TRIG 208 (H) 11/28/2013 1751   HDL 63 06/19/2017 0710   HDL 57 11/28/2013 1751   CHOLHDL 2.9 06/19/2017 0710   VLDL 17 06/19/2017 0710   VLDL 42 (H) 11/28/2013 1751   LDLCALC 101 (H) 06/19/2017 0710   LDLCALC 97 11/28/2013 1751    Physical Exam:    VS:  BP 106/76   Pulse 77   Ht 5\' 6"  (1.676 m)   Wt 240 lb 12.8 oz (109.2 kg)   SpO2 95%   BMI 38.87 kg/m       Wt Readings from Last 3 Encounters:  12/14/17 240 lb 12.8 oz (109.2 kg)  12/26/16 229 lb 6.4 oz (104.1 kg)  10/26/15 237 lb (107.5 kg)     Physical Exam  Constitutional: She is oriented to person, place, and time. She appears well-developed and well-nourished. No distress.  HENT:  Head: Normocephalic and atraumatic.  Neck: Normal range of motion. Neck supple. No JVD present.  Cardiovascular: Normal rate, regular rhythm, normal heart sounds and intact distal pulses. Exam reveals no gallop and no friction rub.  No murmur heard. Pulmonary/Chest: Effort normal and breath sounds normal. No respiratory distress. She has no wheezes. She has no rales.  Abdominal: Soft. Bowel sounds are normal.  Musculoskeletal: Normal range of motion. She exhibits no edema or deformity.  Neurological: She is alert and oriented to person, place, and time.  Skin: Skin is warm and dry.  Psychiatric: She has a normal mood and affect. Her behavior is normal. Judgment and thought content normal.  Vitals reviewed.   ASSESSMENT:    1. Paroxysmal atrial fibrillation (HCC)   2. Obstructive sleep apnea   3. Dyslipidemia    PLAN:    In order of problems listed above:  Paroxysmal atrial fibrillation: Maintaining sinus rhythm on flecainide with only occ brief episodes, no sustained.  She is not anticoagulated due to low stroke risk with a CHA2DS2/VAS Stroke Risk of 1 only for being female. On Aspirin 81 mg daily.   OSA: On CPAP, using consistently  Hyperlipidemia: Most recent LDL in 06/2017 was 101.  Continue current statin   Pre-op eval for bariatric surgery: Pt with hx of afib controlled with flecainide and magnesium. She is low risk for surgery and can proceed with no further cardiac testing. Continue flecainide if possible. Can add BB if needed during perioperative period for control of afib. We are available in the hospital if needed perioperatively.  Can hold aspirin as needed for surgery and resume when  safe and OK by surgeon.   I will efax this note to [facility], Dr. Redmond Pulling.    Medication Adjustments/Labs and Tests Ordered: Current medicines are reviewed at length with the patient today.  Concerns regarding medicines are outlined above. Labs and tests ordered and medication changes are outlined in the patient instructions below:  Patient Instructions  Medication Instructions:  Your physician recommends that you continue on your current medications as directed. Please refer to the Current Medication list given to you today.  If you need a refill on your cardiac medications before your next appointment, please call your pharmacy.   Lab work: None Ordered  If you have labs (blood work) drawn today and your tests are completely normal, you will receive your results only by: Marland Kitchen MyChart Message (if you have MyChart) OR . A paper copy in the mail If you have any lab test that is abnormal or we need to change your treatment, we will call you to review the results.  Testing/Procedures: None Ordered  Follow-Up: At [facility], you and your health needs are our priority.  As part of our continuing mission to provide you with exceptional heart care, we have created designated Provider Care Teams.  These Care Teams include your primary Cardiologist (physician) and Advanced Practice Providers (APPs -  Physician Assistants and Nurse Practitioners) who all work together to provide you with the care you need, when you need it. You will need a follow up appointment in 12 months.  Please call our office 2 months in advance to schedule this appointment.  You may see Dr. Lovena Le or one of the following Advanced Practice Providers on your designated Care Team:   Chanetta Marshall, NP . Tommye Standard, PA-C  Any Other Special Instructions Will Be Listed Below (If Applicable).       Signed, Daune Perch, NP  12/14/2017 2:09 PM    [facility]

## 2017-12-14 NOTE — Patient Instructions (Addendum)
Medication Instructions:  Your physician recommends that you continue on your current medications as directed. Please refer to the Current Medication list given to you today.  If you need a refill on your cardiac medications before your next appointment, please call your pharmacy.   Lab work: None Ordered  If you have labs (blood work) drawn today and your tests are completely normal, you will receive your results only by: Marland Kitchen MyChart Message (if you have MyChart) OR . A paper copy in the mail If you have any lab test that is abnormal or we need to change your treatment, we will call you to review the results.  Testing/Procedures: None Ordered  Follow-Up: At [facility], you and your health needs are our priority.  As part of our continuing mission to provide you with exceptional heart care, we have created designated Provider Care Teams.  These Care Teams include your primary Cardiologist (physician) and Advanced Practice Providers (APPs -  Physician Assistants and Nurse Practitioners) who all work together to provide you with the care you need, when you need it. You will need a follow up appointment in 12 months.  Please call our office 2 months in advance to schedule this appointment.  You may see Dr. Lovena Le or one of the following Advanced Practice Providers on your designated Care Team:   Chanetta Marshall, NP . Tommye Standard, PA-C  Any Other Special Instructions Will Be Listed Below (If Applicable).

## 2017-12-18 ENCOUNTER — Ambulatory Visit
Admission: RE | Admit: 2017-12-18 | Discharge: 2017-12-18 | Disposition: A | Discharge status: Home / Self Care | Payer: 59 | Source: Ambulatory Visit | Attending: General Surgery | Admitting: General Surgery

## 2017-12-18 DIAGNOSIS — K219 Gastro-esophageal reflux disease without esophagitis: Secondary | ICD-10-CM | POA: Diagnosis not present

## 2017-12-18 DIAGNOSIS — E278 Other specified disorders of adrenal gland: Secondary | ICD-10-CM | POA: Insufficient documentation

## 2017-12-18 DIAGNOSIS — I700 Atherosclerosis of aorta: Secondary | ICD-10-CM | POA: Diagnosis not present

## 2017-12-18 DIAGNOSIS — D3501 Benign neoplasm of right adrenal gland: Secondary | ICD-10-CM | POA: Insufficient documentation

## 2017-12-18 DIAGNOSIS — K5730 Diverticulosis of large intestine without perforation or abscess without bleeding: Secondary | ICD-10-CM | POA: Diagnosis not present

## 2017-12-18 DIAGNOSIS — Z01818 Encounter for other preprocedural examination: Secondary | ICD-10-CM | POA: Diagnosis not present

## 2017-12-23 ENCOUNTER — Ambulatory Visit
Admission: RE | Admit: 2017-12-23 | Discharge: 2017-12-23 | Disposition: A | Discharge status: Home / Self Care | Payer: 59 | Source: Ambulatory Visit | Attending: General Surgery | Admitting: General Surgery

## 2017-12-23 ENCOUNTER — Ambulatory Visit: Payer: 59

## 2017-12-23 DIAGNOSIS — K219 Gastro-esophageal reflux disease without esophagitis: Secondary | ICD-10-CM | POA: Diagnosis present

## 2017-12-23 DIAGNOSIS — K449 Diaphragmatic hernia without obstruction or gangrene: Secondary | ICD-10-CM | POA: Diagnosis not present

## 2017-12-25 DIAGNOSIS — Z6841 Body Mass Index (BMI) 40.0 and over, adult: Secondary | ICD-10-CM | POA: Diagnosis not present

## 2017-12-28 ENCOUNTER — Encounter: Payer: 59 | Attending: General Surgery | Admitting: Dietician

## 2017-12-28 ENCOUNTER — Encounter: Payer: Self-pay | Admitting: Dietician

## 2017-12-28 VITALS — Ht 64.5 in | Wt 239.1 lb

## 2017-12-28 DIAGNOSIS — Z6841 Body Mass Index (BMI) 40.0 and over, adult: Secondary | ICD-10-CM | POA: Insufficient documentation

## 2017-12-28 DIAGNOSIS — Z713 Dietary counseling and surveillance: Secondary | ICD-10-CM | POA: Insufficient documentation

## 2017-12-28 NOTE — Patient Instructions (Signed)
   Great job working on [employer] changes!  Concentrate on lean protein foods and low-carb veggies with meals, with small portions of any starchy foods.

## 2017-12-28 NOTE — Progress Notes (Signed)
Nutrition Assessment Proposed Surgery: Sleeve Gastrectomy  MD: Greer Pickerel RD: Erlene Quan  Height: 5'4.25"  Weight: 239.1lbs BMI: 40.41 Upper IBW% (UIBW): 179% (IBW 133.25)  Patient's Goal Weight: 145-150lbs  Medical History: hyperlipidemia, sleep apnea, cardiac arrhythmias, pre-diabetes, arthritis, psoriasis, bulging cervical discs  Medications and Supplements: Aspirin 81mg , cholecalciferol, cyanocobalamin, flecainide, ibuprofen prn, magnesium oxide, rosuvastatin, venlafaxine  Previous surgeries: appendectomy, knee replacements, trigger finger release, diagnostic procedure for ovarian cyst Drug allergies: Ivp dye, Vicryl sutures, Pneumoccocal vaccine Food allergies: possible lactose intolerance (upset stomach after eating [place] yourt 1-2x a day) Alcohol use: 2-3 glasses wine weekly  Tobacco use: none  Physical activity: walking 30 minutes, 2-3 times per week, also trying to increase general daily activity  Weight history: Childhood: normal but stocky build, athletic    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      Weight 1 year ago: 220-225lbs  Dieting/ weight loss history: Patient has tried numerous weight loss diets and medications in the past with limited and short-term success. Weight was 200 in 2012 before knee replacement surgery; has been as low as 160-170lbs during adulthood but only for short period of time. She has weight watchers membership, struggles with controlling portions of "zero point" foods. She has increased her awareness of carbs and sugar in past 6-8 months.   Dietary Recall:  Daily pattern: 3 meals and 2 snacks. Dining out: 1 meal per week. Breakfast: low-sugar instant oatmeal with almond milk Lunch: salads, chicken, [place] burger Supper: chicken, beans, vegetables Snack(s): 100-cal packs of snack foods ie cookies,  loves fruit-- berries, apples, bananas; pre-portioned ice cream bar (has replaced larger portion of dipped ice cream.  Beverages: water, coffee 1-2c daily, usually no sodas  Psychosocial: Emotional eating history: increased eating when stressed, trying to make healthier choices Disordered eating history: none   Intervention:  Patient has researched this procedure by consulting with coworkers and relatives who have had differing experiences with bariatric surgery; job-related experience working with surgery patients, both bariatric and other surgeries.   Instructed her on pre-op diet goals, including liver reduction diet.   Discussed stages of the bariatric diet after surgery as well as the importance of adequate protein and fluid intake.   Summary:  Patient has made diet and lifestyle changes in effort to lose weight and prepare for bariatric surgery.  She has solid support from family, friends, and coworkers.   She agrees to continue working on portion control and gradual decrease in carbohydrate intake prior to surgery.   She is motivated to follow the bariatric diet after surgery. From a nutrition standpoint, she is ready to proceed with the bariatric surgery program.    Plan:  Patient commits to returning for supervised weight loss visits, and pre-op class prior to surgery.   She will plan to return for post-op RD visits beginning 2 weeks after surgery.

## 2017-12-30 ENCOUNTER — Encounter: Payer: 59 | Admitting: Dietician

## 2017-12-30 ENCOUNTER — Encounter: Payer: Self-pay | Admitting: Dietician

## 2017-12-30 VITALS — Ht 64.5 in | Wt 238.1 lb

## 2017-12-30 DIAGNOSIS — Z6841 Body Mass Index (BMI) 40.0 and over, adult: Secondary | ICD-10-CM | POA: Diagnosis not present

## 2017-12-30 DIAGNOSIS — Z713 Dietary counseling and surveillance: Secondary | ICD-10-CM | POA: Diagnosis not present

## 2017-12-30 NOTE — Patient Instructions (Signed)
   Keep carbohydrate intake to 45grams or less with each meal; continue to gradually decrease prior to pre-op diet and surgery.   Continue with goals from 12/28/17 visit.

## 2017-12-30 NOTE — Progress Notes (Signed)
Appt start time: 1030 end time:  1100.  Assessment:   #1 SWL Appointment.   Start Wt at [facility]: 239.1lbs Wt: 238.1lbs Ht: 5'4.5" BMI: 40.24  Preferred Learning Style:   Auditory  Visual  Hands on  Learning Readiness:   Change in progress  MEDICATIONS: Aspirin 81mg , cholecalciferol, cyanocobalamin, flecainide, ibuprofen prn, magnesium oxide, rosuvastatin, venlafaxine   DIETARY INTAKE:  24-hr recall:  Breakfast: low-sugar instant oatmeal with almond milk  Snack: fruit or 100-cal pkg cookies  Lunch: usually salad with grilled chicken or [place] burger Snack: none or same as am Dinner: chicken, [place], beans, rice or pasta, vegetables Snack: fruit, low-cal cookies, or pre-portioned ice cream bar Beverages: water, 1-2c coffee  Usual physical activity: walking 30 minutes, 2-3 times a week + increasing general daily activity  Diet to Follow: 45 g carbohydrates or less Continue with balanced meals              Nutritional Diagnosis:  [place]-3.3 Overweight/obesity related to history of excess calories and physical inactivity as evidenced by patient with current BMI of 40.24, following dietary guidelines for weight loss.              Intervention:  Nutrition counseling for weight loss prior to upcoming Bariatric Surgery.   Used plate method for basic meal planning instruction and identifying carbohydrate foods and appropriate food portions.   Teaching Method Utilized:  Visual Auditory Hands on  Handouts given during visit include:  Plate planner with food lists   Barriers to learning/adherence to lifestyle change: none  Demonstrated degree of understanding via:  Teach Back   Monitoring/Evaluation:  Dietary intake, exercise, and body weight 01/25/18.

## 2018-01-25 ENCOUNTER — Encounter: Payer: 59 | Attending: General Surgery | Admitting: Dietician

## 2018-01-25 ENCOUNTER — Encounter: Payer: Self-pay | Admitting: Dietician

## 2018-01-25 VITALS — Ht 64.5 in | Wt 237.0 lb

## 2018-01-25 DIAGNOSIS — Z6841 Body Mass Index (BMI) 40.0 and over, adult: Secondary | ICD-10-CM | POA: Diagnosis not present

## 2018-01-25 DIAGNOSIS — Z713 Dietary counseling and surveillance: Secondary | ICD-10-CM | POA: Insufficient documentation

## 2018-01-25 NOTE — Patient Instructions (Signed)
   Keep carb intake to 30grams or less per meal.   Continue with lean protein foods and plenty of low-carb veggies.   Use added fats sparingly -- butter, margarine, oils, mayo, salad dressing.

## 2018-01-25 NOTE — Progress Notes (Signed)
Appt start time: 0158 end time:  1800.  Assessment:   #2 SWL Appointment.   Start Wt at [facility]: 238.1 lbs Wt: 237.0lbslbs Ht: 5'4.5" BMI: 40  Preferred Learning Style:   Auditory  Visual  Hands on   Learning Readiness:   Change in progress  MEDICATIONS: Aspirin 81mg , cholecalciferol, cyanocobalamin, flecainide, ibuprofen prn, magnesium oxide, rosuvastatin, venlafaxine  Progress: Patient reports slow progress with any diet changes over the past month due to 1 week of illness, followed by death of her mother. She has continued to work on portion control and healthy choices as able.   DIETARY INTAKE:  24-hr recall:  Breakfast: low-sugar oatmeal with almond milk  Snack: fruit or 100cal pkg snack or fiber one bar  Lunch: leftovers-- lean proteins; healthy choice power bowl Snack: fruit apple pear, or banana with small amount peanut butter Dinner: chicken/ [place] + beans/rice/pasta + low-carb veg; homemade healthy chili Snack: same as am or pm Beverages: water, coffee with sweetnlow  Usual physical activity: none recently due to illness + stress; plans to resume.   Diet to Follow: 30 g carbohydrates lean protein choices Limited added fats              Nutritional Diagnosis:  [place]-3.3 Overweight/obesity related to history of excess calories and physical inactivity as evidenced by patient following dietary guidelines for continued weight loss prior to bariatric surgery.              Intervention:    Nutrition counseling for weight loss prior to bariatric surgery.  Reviewed pre-op and post-op diet guidelines.  Discussed gradual reduction in carbohydrate intake and resuming physical activity for ongoing weight loss.   Teaching Method Utilized:  Visual Auditory Hands on  Handouts given during visit include: goals and instructions  Barriers to learning/adherence to lifestyle change: none  Demonstrated degree of understanding via:  Teach Back   Monitoring/Evaluation:   Dietary intake, exercise, and body weight 12/30/17.

## 2018-01-27 ENCOUNTER — Ambulatory Visit: Payer: Self-pay | Admitting: Internal Medicine

## 2018-02-08 ENCOUNTER — Ambulatory Visit: Payer: 59 | Admitting: Psychology

## 2018-02-12 ENCOUNTER — Ambulatory Visit: Payer: Self-pay

## 2018-02-12 ENCOUNTER — Telehealth: Payer: Self-pay | Admitting: Dietician

## 2018-02-12 NOTE — Telephone Encounter (Signed)
Patient did not come to pre-op class this morning; called her and she had the wrong day listed. She rescheduled to 03/19/18 for the class and will keep her next RD appointment on 03/01/18.

## 2018-02-16 ENCOUNTER — Ambulatory Visit: Payer: 59 | Admitting: Psychology

## 2018-03-01 ENCOUNTER — Encounter: Payer: 59 | Attending: General Surgery | Admitting: Dietician

## 2018-03-01 ENCOUNTER — Encounter: Payer: Self-pay | Admitting: Dietician

## 2018-03-01 ENCOUNTER — Ambulatory Visit (INDEPENDENT_AMBULATORY_CARE_PROVIDER_SITE_OTHER): Payer: 59 | Admitting: Psychology

## 2018-03-01 VITALS — Ht 64.5 in | Wt 235.1 lb

## 2018-03-01 DIAGNOSIS — Z6841 Body Mass Index (BMI) 40.0 and over, adult: Secondary | ICD-10-CM | POA: Insufficient documentation

## 2018-03-01 DIAGNOSIS — F509 Eating disorder, unspecified: Secondary | ICD-10-CM | POA: Diagnosis not present

## 2018-03-01 DIAGNOSIS — E6609 Other obesity due to excess calories: Secondary | ICD-10-CM

## 2018-03-01 DIAGNOSIS — Z713 Dietary counseling and surveillance: Secondary | ICD-10-CM | POA: Diagnosis not present

## 2018-03-01 DIAGNOSIS — Z6839 Body mass index (BMI) 39.0-39.9, adult: Secondary | ICD-10-CM

## 2018-03-01 NOTE — Patient Instructions (Signed)
   Great job working on [employer]!  Continue to limit carbs, aim for 15g average with meals.   Keep working to plan ahead for balanced meals, have quick back-up options including healthy frozen meals, pre-cooked proteins + frozen veggies or salad, etc.   Continue to build up a regular exercise routine.

## 2018-03-01 NOTE — Progress Notes (Signed)
Appt start time: 1530 end time:  1600.  Assessment:   #3 SWL Appointment.   Start Wt at [facility]: 239.1lbs Wt: 235.1lbs Ht:5'4.5" BMI: 39.7  Preferred Learning Style:   Auditory  Visual  Hands on   Learning Readiness:   Change in progress  MEDICATIONS: aspirin 81mg , cholecalciferol, cyanocobalamin, flecainide, ibuprofen prn, magnesium oxide, rosuvastatin, venlafaxine  Progress: Patient has been trying various protein drinks, finding what works for her; and increasing water. Continues to reduce carbohydrate intake. She voices ongoing frustration with inability to lose much weight despite making healthy changes.  DIETARY INTAKE:  24-hr recall:  Breakfast: protein drink or oatmeal or eggs with small amount cheese, no carbs -- depends on work schedule  Snack: 100-cal snack pack of almonds/ cashews  Lunch: protein drink if solid breakfast (or first meal on weekends); or light meal sometimes fruit or leftovers or healthy frozen meal or tuna/egg/ chicken salad Snack: fruit Dinner: cauliflower/broccoli rice + lean meat; salad with chicken or fish/ shellfish Snack: some ice cream recently; plans to stop Beverages: water, coffee with sweet-n-low  Usual physical activity: walking on weekends  40-45 minutes  Diet to Follow: 15 g carbohydrates Lean protein Low fat choices              Nutritional Diagnosis:  [place]-3.3 Overweight/obesity related to history of excess calories and physical inactivity as evidenced by patient with current BMI of 39.7, following dietary guidelines for continued weight loss prior to bariatric surgery.              Intervention:    Nutrition counseling for weight loss prior to upcoming bariatric surgery.  Patient will work to further reduce carbohydrate intake. She is considering drinking 1-2 protein shakes daily and eating 1-2 meals daily. Discussed importance of avoiding overuse of protein shakes before surgery as she will be consuming them exclusively for 2  weeks after surgery.   Reviewed pre-op diet.    Teaching Method Utilized:  Visual Auditory Hands on  Handouts given during visit include:  Goals and instructions   Barriers to learning/adherence to lifestyle change: none  Demonstrated degree of understanding via:  Teach Back   Monitoring/Evaluation:  Dietary intake, exercise, and body weight 03/19/18 for pre-op class.

## 2018-03-09 ENCOUNTER — Ambulatory Visit (INDEPENDENT_AMBULATORY_CARE_PROVIDER_SITE_OTHER): Payer: 59 | Admitting: Psychology

## 2018-03-09 DIAGNOSIS — F509 Eating disorder, unspecified: Secondary | ICD-10-CM

## 2018-03-19 ENCOUNTER — Encounter: Payer: 59 | Attending: General Surgery | Admitting: Dietician

## 2018-03-19 DIAGNOSIS — Z6841 Body Mass Index (BMI) 40.0 and over, adult: Secondary | ICD-10-CM | POA: Diagnosis not present

## 2018-03-19 DIAGNOSIS — Z6839 Body mass index (BMI) 39.0-39.9, adult: Secondary | ICD-10-CM

## 2018-03-19 DIAGNOSIS — E6609 Other obesity due to excess calories: Secondary | ICD-10-CM

## 2018-03-19 DIAGNOSIS — Z713 Dietary counseling and surveillance: Secondary | ICD-10-CM | POA: Diagnosis not present

## 2018-03-19 NOTE — Progress Notes (Signed)
Pre-Operative Nutrition Class:  Appt start time: 0900   End time:  1100.  Patient was seen on 03/19/18 for pre-operative bariatric surgery education at [facility].   Surgery date: 04/19/18 Surgery type: Sleeve gastrectomy Start weight at [facility]: 239.1lbs Weight today: 235.1lbs  InBody  BODY COMP RESULTS    BMI (kg/m^2) 39.9  Fat Mass (lbs) 115.4  Fat Free Mass (lbs) 117.1  Total Body Water (lbs) 86.0   Samples given per MNT protocol. Patient educated on appropriate usage:  Celebrate Vitamins Multivitamin Lot # [id number] Exp: 04/2018 Lot # [id number] Exp: 07/2018  Celebrate Vitamins Calcium Citrate Lot # [id number] Exp: 12/2018;  Lot# [id number] Exp: 03/2019 Lot #[id number] Exp: 02/2019   Renee Pain Protein Powder Lot # [id number] Exp: 09/2018;  Lot# [id number] Exp: 12/2018 Lot # [id number] Exp: 12/2018  Renee Pain Protein Shake Lot# [id number] Exp: 05/08/2018  Premier Protein Shake  Lot # [id number] Exp: 06/01/2018  The following the learning objectives were met by the patient during this course:  Identify Pre-Op Dietary Goals and will begin 2 weeks pre-operatively  Identify appropriate sources of fluids and proteins   State protein recommendations and appropriate sources pre and post-operatively  Identify Post-Operative Dietary Goals and will follow for 2 weeks post-operatively  Identify appropriate multivitamin and calcium sources  Describe the need for physical activity post-operatively and will follow MD recommendations  State when to call healthcare provider regarding medication questions or post-operative complications  Handouts given during class include:  Pre-Op Bariatric Surgery Diet Handout  Protein Shake Handout  Post-Op Bariatric Surgery Nutrition Handout  BELT Program Information Flyer  Support Group Information Flyer  [facility] Bariatric Supplements Price List  Follow-Up Plan: Patient will follow-up at [place], at about 2 weeks post operatively for diet  advancement per MD.

## 2018-03-25 ENCOUNTER — Encounter (HOSPITAL_COMMUNITY): Payer: Self-pay | Admitting: *Deleted

## 2018-04-06 NOTE — Progress Notes (Signed)
Please place orders in Epic as patient has a pre-op appointment on 04/07/2018! Thank you!

## 2018-04-06 NOTE — Patient Instructions (Addendum)
Brianna French  04/06/2018   Your procedure is scheduled on: Monday 04/19/2018  Report to [facility] Main  Entrance              Report to admitting at  0735  AM    Call this number if you have problems the morning of surgery [phone]               Please bring CPAP mask and tubing with you to the hospital!   Remember: Do not eat food or drink liquids :After Midnight.              BRUSH YOUR TEETH MORNING OF SURGERY AND RINSE YOUR MOUTH OUT, NO CHEWING GUM CANDY OR MINTS.     Take these medicines the morning of surgery with A SIP OF WATER: Flecainide (Tambocor), Rosuvastatin (Crestor), Venlafaxine XR (Effexor-XR)                                  You may not have any metal on your body including hair pins and              piercings  Do not wear jewelry, make-up, lotions, powders or perfumes, deodorant             Do not wear nail polish.  Do not shave  48 hours prior to surgery.               Do not bring valuables to the hospital. [place].  Contacts, dentures or bridgework may not be worn into surgery.  Leave suitcase in the car. After surgery it may be brought to your room.                  Please read over the following fact sheets you were given: _____________________________________________________________________             [facility] - Preparing for Surgery Before surgery, you can play an important role.  Because skin is not sterile, your skin needs to be as free of germs as possible.  You can reduce the number of germs on your skin by washing with CHG (chlorahexidine gluconate) soap before surgery.  CHG is an antiseptic cleaner which kills germs and bonds with the skin to continue killing germs even after washing. Please DO NOT use if you have an allergy to CHG or antibacterial soaps.  If your skin becomes reddened/irritated stop using the CHG and inform your nurse when you arrive at  Short Stay. Do not shave (including legs and underarms) for at least 48 hours prior to the first CHG shower.  You may shave your face/neck. Please follow these instructions carefully:  1.  Shower with CHG Soap the night before surgery and the  morning of Surgery.  2.  If you choose to wash your hair, wash your hair first as usual with your  normal  shampoo.  3.  After you shampoo, rinse your hair and body thoroughly to remove the  shampoo.                           4.  Use CHG as you would any other liquid soap.  You  can apply chg directly  to the skin and wash                       Gently with a scrungie or clean washcloth.  5.  Apply the CHG Soap to your body ONLY FROM THE NECK DOWN.   Do not use on face/ open                           Wound or open sores. Avoid contact with eyes, ears mouth and genitals (private parts).                       Wash face,  Genitals (private parts) with your normal soap.             6.  Wash thoroughly, paying special attention to the area where your surgery  will be performed.  7.  Thoroughly rinse your body with warm water from the neck down.  8.  DO NOT shower/wash with your normal soap after using and rinsing off  the CHG Soap.                9.  Pat yourself dry with a clean towel.            10.  Wear clean pajamas.            11.  Place clean sheets on your bed the night of your first shower and do not  sleep with pets. Day of Surgery : Do not apply any lotions/deodorants the morning of surgery.  Please wear clean clothes to the hospital/surgery center.  FAILURE TO FOLLOW THESE INSTRUCTIONS MAY RESULT IN THE CANCELLATION OF YOUR SURGERY PATIENT SIGNATURE_________________________________  NURSE SIGNATURE__________________________________  ________________________________________________________________________

## 2018-04-07 ENCOUNTER — Other Ambulatory Visit: Payer: Self-pay

## 2018-04-07 ENCOUNTER — Encounter (HOSPITAL_COMMUNITY)
Admission: RE | Admit: 2018-04-07 | Discharge: 2018-04-07 | Disposition: A | Discharge status: Home / Self Care | Payer: 59 | Source: Ambulatory Visit | Attending: General Surgery | Admitting: General Surgery

## 2018-04-07 ENCOUNTER — Encounter (HOSPITAL_COMMUNITY): Payer: Self-pay

## 2018-04-07 DIAGNOSIS — Z01812 Encounter for preprocedural laboratory examination: Secondary | ICD-10-CM | POA: Diagnosis not present

## 2018-04-07 HISTORY — DX: Cardiac arrhythmia, unspecified: I49.9

## 2018-04-07 HISTORY — DX: Major depressive disorder, single episode, unspecified: F32.9

## 2018-04-07 HISTORY — DX: Unspecified osteoarthritis, unspecified site: M19.90

## 2018-04-07 HISTORY — DX: Depression, unspecified: F32.A

## 2018-04-07 HISTORY — DX: Sleep apnea, unspecified: G47.30

## 2018-04-07 LAB — BASIC METABOLIC PANEL
ANION GAP: 7 (ref 5–15)
BUN: 16 mg/dL (ref 6–20)
CO2: 26 mmol/L (ref 22–32)
Calcium: 9.9 mg/dL (ref 8.9–10.3)
Chloride: 106 mmol/L (ref 98–111)
Creatinine, Ser: 0.76 mg/dL (ref 0.44–1.00)
GFR calc Af Amer: >60 mL/min (ref >60)
GFR calc non Af Amer: >60 mL/min (ref >60)
Glucose, Bld: 94 mg/dL (ref 70–99)
Potassium: 4.3 mmol/L (ref 3.5–5.1)
Sodium: 139 mmol/L (ref 135–145)

## 2018-04-07 LAB — CBC
HCT: 46.5 % — ABNORMAL HIGH (ref 36.0–46.0)
Hemoglobin: 14.9 g/dL (ref 12.0–15.0)
MCH: 31.8 pg (ref 26.0–34.0)
MCHC: 32 g/dL (ref 30.0–36.0)
MCV: 99.4 fL (ref 80.0–100.0)
Platelets: 252 10*3/uL (ref 150–400)
RBC: 4.68 MIL/uL (ref 3.87–5.11)
RDW: 12.8 % (ref 11.5–15.5)
WBC: 5.4 10*3/uL (ref 4.0–10.5)
nRBC: 0 % (ref 0.0–0.2)

## 2018-04-07 NOTE — Progress Notes (Signed)
12/18/2017- noted in Epic-CXR  12/14/2017- noted in Epic- EKG

## 2018-04-08 ENCOUNTER — Ambulatory Visit: Payer: Self-pay | Admitting: General Surgery

## 2018-04-08 DIAGNOSIS — K219 Gastro-esophageal reflux disease without esophagitis: Secondary | ICD-10-CM | POA: Diagnosis not present

## 2018-04-08 DIAGNOSIS — K449 Diaphragmatic hernia without obstruction or gangrene: Secondary | ICD-10-CM | POA: Diagnosis not present

## 2018-04-08 DIAGNOSIS — E278 Other specified disorders of adrenal gland: Secondary | ICD-10-CM | POA: Diagnosis not present

## 2018-04-08 DIAGNOSIS — Z8679 Personal history of other diseases of the circulatory system: Secondary | ICD-10-CM | POA: Diagnosis not present

## 2018-04-08 DIAGNOSIS — M5030 Other cervical disc degeneration, unspecified cervical region: Secondary | ICD-10-CM | POA: Diagnosis not present

## 2018-04-08 DIAGNOSIS — M130 Polyarthritis, unspecified: Secondary | ICD-10-CM | POA: Diagnosis not present

## 2018-04-08 DIAGNOSIS — R7303 Prediabetes: Secondary | ICD-10-CM | POA: Diagnosis not present

## 2018-04-08 DIAGNOSIS — G4730 Sleep apnea, unspecified: Secondary | ICD-10-CM | POA: Diagnosis not present

## 2018-04-08 NOTE — H&P (Signed)
CAOILAINN SACKS Documented: 04/08/2018 3:54 PM Location: [facility] Patient #: [id number] DOB: 30-Oct-1960 Married / Language: Cleophus Molt / Race: White Female  History of Present Illness Randall Hiss M. Fate Caster MD; 04/08/2018 4:30 PM) The patient is a 58 year old female who presents for a bariatric surgery evaluation. She comes in for preoperative appointment. She is accompanied by her husband. She denies any medical changes since her initial visit. She denies any trips to the emergency room hospital. She denies any chest pain, chest pressure, shortness of breath, dyspnea on exertion, diarrhea or melena or hematochezia. She denies any dysuria. She denies any TIAs or 100 fugax. She really doesn't have any heartburn. She used to have some may be related to certain foods  Her bariatric evaluation labs were unremarkable. Vitamin levels normal. Upper GI showed a small hiatal hernia.  12/2017 She is referred by Dr Doy Hutching for evaluation of weight loss surgery. Her cardiologist is Dr Lovena Le. Her OB is Dr Leonides Schanz. She actually attended 2 different weight loss surgery seminars. She is interested in sleeve gastrectomy. She is not interested in having her intestines rerouted. Despite numerous attempts for sustained weight loss she has been unsuccessful. She is even tried prescription medication without any significant long-term results. She is accompanied by her husband  Her comorbidities include osteoarthritis of multiple joints, degenerative disc disease of the cervical spine, hypertension, paroxysmal supraventricular tachycardia, history of a flutter, hyperlipidemia, obstructive sleep apnea on CPAP.  She denies any chest pain, chest pressure, short of breath, orthopnea, dyspnea on exertion, TIAs or amaurosis fugax. She denies any chest tightness. She does take an antiarrhythmic. She has a history of atrial flutter and A. fib. She had a stress test in 2017 which was normal. She uses CPAP. She  denies any personal or family history of blood clots. She reports a significant family history of cardiac events and diabetes mellitus.  She has occasional heartburn. She takes Zantac a few times a week. She denies any sensation of food or liquid getting stuck. She has a bowel movement a few times a week. She has had a laparoscopic appendectomy as well as laparoscopic ovarian cyst removal. She denies any melena or hematochezia. She denies any dysuria or hematuria. She does have some stress urinary incontinence. She has arthritis in multiple joints. She has had a knee replacement. She is dealing with some cervical disc disease causing left upper extremity tingling and numbness. She has had a steroid injection the past. She is scheduled to get an epidural injection next week. She has some occasional headaches.  She denies any tobacco use. She drinks alcohol on an infrequent occasion. She denies any drug use.  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  She had blood work in April 2019. A1c was 5.7. She had a normal metabolic panel and normal CBC. Lipid panel showed a LDL level of 101.  When she had her appendicitis in 2016 she had a CT scan which also showed a 3 cm right adrenal mass. She has not received any additional further work workup so far   Problem List/Past Medical Randall Hiss M. Redmond Pulling, MD; 04/08/2018 4:32 PM) PREDIABETES (R73.03) DEGENERATIVE DISC DISEASE, CERVICAL (M50.30) ADRENAL MASS, RIGHT (E27.8) MORBID OBESITY (E66.01) HIATAL HERNIA (K44.9)  Past Surgical History Randall Hiss M. Redmond Pulling, MD; 04/08/2018 4:32 PM) Appendectomy Knee Surgery Bilateral. Oral Surgery Tonsillectomy  Diagnostic Studies History Randall Hiss M. Redmond Pulling, MD; 04/08/2018 4:32 PM) Colonoscopy 5-10 years ago Mammogram within last year Pap Smear 1-5 years  ago  Allergies (Tanisha A. Owens Shark, [place]; 04/08/2018 3:59 PM) Dyes Hives. IVP Allergies Reconciled  Medication History (Tanisha A. Owens Shark,  [place]; 04/08/2018 3:59 PM) Effexor XR (150MG  Capsule ER 24HR, Oral) Active. Crestor (10MG  Tablet, Oral) Active. Flecainide Acetate (100MG  Tablet, Oral) Active. Magnesium (800mg  Oral) Specific strength unknown - Active. Vitamin B12 (1000MCG Tablet ER, Oral) Active. Aspirin (81MG  Tablet, Oral) Active. Medications Reconciled  Social History Randall Hiss M. Redmond Pulling, MD; 04/08/2018 4:32 PM) Alcohol use Occasional alcohol use. Caffeine use Coffee. No drug use Tobacco use Former smoker.  Family History Randall Hiss M. Redmond Pulling, MD; 04/08/2018 4:32 PM) Arthritis Brother, Daughter, Mother, Sister, Son. Bleeding disorder Family Members In General. Cancer Son. Cerebrovascular Accident Mother. Depression Daughter. Diabetes Mellitus Brother, Daughter, Father, Mother, Sister, Son. Heart Disease Brother, Daughter, Father, Mother, Sister, Son. Heart disease in female family member before age 79 Heart disease in female family member before age 65 Hypertension Brother, Daughter, Mother, Son. Kidney Disease Mother, Sister.  Pregnancy / Birth History Randall Hiss M. Redmond Pulling, MD; 04/08/2018 4:32 PM) Age at menarche 27 years. Age of menopause 51-55 Contraceptive History Contraceptive implant. Gravida 4 Maternal age 58-30 Para 4  Other Problems Leighton Ruff. Redmond Pulling, MD; 04/08/2018 4:32 PM) Back Pain Other disease, cancer, significant illness GASTROESOPHAGEAL REFLUX DISEASE, ESOPHAGITIS PRESENCE NOT SPECIFIED (K21.9) ARTHRITIS OF MULTIPLE SITES (M13.0) SLEEP APNEA IN ADULT (G47.30) HISTORY OF ATRIAL FLUTTER (Z86.79)     Review of Systems Randall Hiss M. Emerie Vanderkolk MD; 04/08/2018 4:29 PM) All other systems negative  Vitals (Tanisha A. Brown RMA; 04/08/2018 3:58 PM) 04/08/2018 3:58 PM Weight: 227.6 lb Height: 64.25in Body Surface Area: 2.07 m Body Mass Index: 38.76 kg/m  Temp.: 98.44F  Pulse: 98 (Regular)  BP: 116/82 (Sitting, Left Arm, Standard)      Physical Exam Randall Hiss M. Vondell Sowell MD; 04/08/2018  4:28 PM)  General Mental Status-Alert. General Appearance-Consistent with stated age. Hydration-Well hydrated. Voice-Normal. Note: severe obesity  Head and Neck Head-normocephalic, atraumatic with no lesions or palpable masses. Trachea-midline. Thyroid Gland Characteristics - normal size and consistency.  Eye Eyeball - Bilateral-Normal. Sclera/Conjunctiva - Bilateral-No scleral icterus.  ENMT Ears -Note:nml external ears.  Mouth and Throat -Note:lips intact.   Chest and Lung Exam Chest and lung exam reveals -quiet, even and easy respiratory effort with no use of accessory muscles and on auscultation, normal breath sounds, no adventitious sounds and normal vocal resonance. Inspection Chest Wall - Normal. Back - normal.  Breast - Did not examine.  Cardiovascular Cardiovascular examination reveals -normal heart sounds, regular rate and rhythm with no murmurs and normal pedal pulses bilaterally.  Abdomen Inspection Inspection of the abdomen reveals - No Hernias. Skin - Scar - Note: well healed trocar scars. Palpation/Percussion Palpation and Percussion of the abdomen reveal - Soft, Non Tender, No Rebound tenderness, No Rigidity (guarding) and No hepatosplenomegaly. Auscultation Auscultation of the abdomen reveals - Bowel sounds normal.  Peripheral Vascular Upper Extremity Palpation - Pulses bilaterally normal.  Neurologic Neurologic evaluation reveals -alert and oriented x 3 with no impairment of recent or remote memory. Mental Status-Normal.  Neuropsychiatric The patient's mood and affect are described as -normal. Judgment and Insight-insight is appropriate concerning matters relevant to self.  Musculoskeletal Normal Exam - Left-Upper Extremity Strength Normal and Lower Extremity Strength Normal. Normal Exam - Right-Upper Extremity Strength Normal and Lower Extremity Strength Normal. Note: b/l knee  crepitus  Lymphatic Head & Neck  General Head & Neck Lymphatics: Bilateral - Description - Normal. Axillary - Did not examine. Femoral & Inguinal - Did not examine.  Assessment & Plan Randall Hiss M. Karn Derk MD; 04/08/2018 4:32 PM)  MORBID OBESITY (E66.01) Impression: The patient meets weight loss surgery criteria. I think the patient would be an acceptable candidate for Laparoscopic vertical sleeve gastrectomy.  We rediscussed LSG. We rediscussed the preoperative, operative and postoperative process. I discussed the typical recovery. we also discussed the typical issues that we see after surgery.  I spent about 25 minutes answering all of her questions.  She has an issue with Vicryl suture so we will use a Prolene or PDS for the fascial closure site  Current Plans Pt Education - EMW_preopbariatric  PREDIABETES (R73.03)   DEGENERATIVE DISC DISEASE, CERVICAL (M50.30)   GASTROESOPHAGEAL REFLUX DISEASE, ESOPHAGITIS PRESENCE NOT SPECIFIED (K21.9) Impression: did discuss that GERD can worsen after sleeve gastrectomy with potential need for revisionsal surgery   ARTHRITIS OF MULTIPLE SITES (M13.0) Impression: can take nsaids after sleeve as opposed to gastric bypass so another reason she is desiring sleeve gastrectomy.   HISTORY OF ATRIAL FLUTTER (Z86.79)   SLEEP APNEA IN ADULT (G47.30)   HIATAL HERNIA (K44.9) Impression: We discussed the finding of a small hiatal hernia. We discussed hernias. I advised her that we would test for one intraoperatively. If found to have a clinically significant I recommended intraoperative repair. We discussed what that would involve   ADRENAL MASS, RIGHT (E27.8) Impression: stable on repeat CT. c/w benign adenoma  Leighton Ruff. Redmond Pulling, MD, FACS General, Bariatric, & Minimally Invasive Surgery [facility], [place]

## 2018-04-08 NOTE — H&P (View-Only) (Signed)
MADILYNE TADLOCK Documented: 04/08/2018 3:54 PM Location: [facility] Patient #: [id number] DOB: [birth year] Married / Language: Cleophus Molt / Race: White Female  History of Present Illness Randall Hiss M. Lason Eveland MD; 04/08/2018 4:30 PM) The patient is a 58 year old female who presents for a bariatric surgery evaluation. She comes in for preoperative appointment. She is accompanied by her husband. She denies any medical changes since her initial visit. She denies any trips to the emergency room hospital. She denies any chest pain, chest pressure, shortness of breath, dyspnea on exertion, diarrhea or melena or hematochezia. She denies any dysuria. She denies any TIAs or 100 fugax. She really doesn't have any heartburn. She used to have some may be related to certain foods  Her bariatric evaluation labs were unremarkable. Vitamin levels normal. Upper GI showed a small hiatal hernia.  12/2017 She is referred by Dr Doy Hutching for evaluation of weight loss surgery. Her cardiologist is Dr Lovena Le. Her OB is Dr Leonides Schanz. She actually attended 2 different weight loss surgery seminars. She is interested in sleeve gastrectomy. She is not interested in having her intestines rerouted. Despite numerous attempts for sustained weight loss she has been unsuccessful. She is even tried prescription medication without any significant long-term results. She is accompanied by her husband  Her comorbidities include osteoarthritis of multiple joints, degenerative disc disease of the cervical spine, hypertension, paroxysmal supraventricular tachycardia, history of a flutter, hyperlipidemia, obstructive sleep apnea on CPAP.  She denies any chest pain, chest pressure, short of breath, orthopnea, dyspnea on exertion, TIAs or amaurosis fugax. She denies any chest tightness. She does take an antiarrhythmic. She has a history of atrial flutter and A. fib. She had a stress test in 2017 which was normal. She uses CPAP. She  denies any personal or family history of blood clots. She reports a significant family history of cardiac events and diabetes mellitus.  She has occasional heartburn. She takes Zantac a few times a week. She denies any sensation of food or liquid getting stuck. She has a bowel movement a few times a week. She has had a laparoscopic appendectomy as well as laparoscopic ovarian cyst removal. She denies any melena or hematochezia. She denies any dysuria or hematuria. She does have some stress urinary incontinence. She has arthritis in multiple joints. She has had a knee replacement. She is dealing with some cervical disc disease causing left upper extremity tingling and numbness. She has had a steroid injection the past. She is scheduled to get an epidural injection next week. She has some occasional headaches.  She denies any tobacco use. She drinks alcohol on an infrequent occasion. She denies any drug use.  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  She had blood work in April 2019. A1c was 5.7. She had a normal metabolic panel and normal CBC. Lipid panel showed a LDL level of 101.  When she had her appendicitis in 2016 she had a CT scan which also showed a 3 cm right adrenal mass. She has not received any additional further work workup so far   Problem List/Past Medical Randall Hiss M. Redmond Pulling, MD; 04/08/2018 4:32 PM) PREDIABETES (R73.03) DEGENERATIVE DISC DISEASE, CERVICAL (M50.30) ADRENAL MASS, RIGHT (E27.8) MORBID OBESITY (E66.01) HIATAL HERNIA (K44.9)  Past Surgical History Randall Hiss M. Redmond Pulling, MD; 04/08/2018 4:32 PM) Appendectomy Knee Surgery Bilateral. Oral Surgery Tonsillectomy  Diagnostic Studies History Randall Hiss M. Redmond Pulling, MD; 04/08/2018 4:32 PM) Colonoscopy 5-10 years ago Mammogram within last year Pap Smear 1-5 years  ago  Allergies (Tanisha A. Owens Shark, [place]; 04/08/2018 3:59 PM) Dyes Hives. IVP Allergies Reconciled  Medication History (Tanisha A. Owens Shark,  [place]; 04/08/2018 3:59 PM) Effexor XR (150MG  Capsule ER 24HR, Oral) Active. Crestor (10MG  Tablet, Oral) Active. Flecainide Acetate (100MG  Tablet, Oral) Active. Magnesium (800mg  Oral) Specific strength unknown - Active. Vitamin B12 (1000MCG Tablet ER, Oral) Active. Aspirin (81MG  Tablet, Oral) Active. Medications Reconciled  Social History Randall Hiss M. Redmond Pulling, MD; 04/08/2018 4:32 PM) Alcohol use Occasional alcohol use. Caffeine use Coffee. No drug use Tobacco use Former smoker.  Family History Randall Hiss M. Redmond Pulling, MD; 04/08/2018 4:32 PM) Arthritis Brother, Daughter, Mother, Sister, Son. Bleeding disorder Family Members In General. Cancer Son. Cerebrovascular Accident Mother. Depression Daughter. Diabetes Mellitus Brother, Daughter, Father, Mother, Sister, Son. Heart Disease Brother, Daughter, Father, Mother, Sister, Son. Heart disease in female family member before age 62 Heart disease in female family member before age 63 Hypertension Brother, Daughter, Mother, Son. Kidney Disease Mother, Sister.  Pregnancy / Birth History Randall Hiss M. Redmond Pulling, MD; 04/08/2018 4:32 PM) Age at menarche 50 years. Age of menopause 51-55 Contraceptive History Contraceptive implant. Gravida 4 Maternal age 15-30 Para 4  Other Problems Leighton Ruff. Redmond Pulling, MD; 04/08/2018 4:32 PM) Back Pain Other disease, cancer, significant illness GASTROESOPHAGEAL REFLUX DISEASE, ESOPHAGITIS PRESENCE NOT SPECIFIED (K21.9) ARTHRITIS OF MULTIPLE SITES (M13.0) SLEEP APNEA IN ADULT (G47.30) HISTORY OF ATRIAL FLUTTER (Z86.79)     Review of Systems Randall Hiss M. Keimari  MD; 04/08/2018 4:29 PM) All other systems negative  Vitals (Tanisha A. Brown RMA; 04/08/2018 3:58 PM) 04/08/2018 3:58 PM Weight: 227.6 lb Height: 64.25in Body Surface Area: 2.07 m Body Mass Index: 38.76 kg/m  Temp.: 98.36F  Pulse: 98 (Regular)  BP: 116/82 (Sitting, Left Arm, Standard)      Physical Exam Randall Hiss M. Ladeja Pelham MD; 04/08/2018  4:28 PM)  General Mental Status-Alert. General Appearance-Consistent with stated age. Hydration-Well hydrated. Voice-Normal. Note: severe obesity  Head and Neck Head-normocephalic, atraumatic with no lesions or palpable masses. Trachea-midline. Thyroid Gland Characteristics - normal size and consistency.  Eye Eyeball - Bilateral-Normal. Sclera/Conjunctiva - Bilateral-No scleral icterus.  ENMT Ears -Note:nml external ears.  Mouth and Throat -Note:lips intact.   Chest and Lung Exam Chest and lung exam reveals -quiet, even and easy respiratory effort with no use of accessory muscles and on auscultation, normal breath sounds, no adventitious sounds and normal vocal resonance. Inspection Chest Wall - Normal. Back - normal.  Breast - Did not examine.  Cardiovascular Cardiovascular examination reveals -normal heart sounds, regular rate and rhythm with no murmurs and normal pedal pulses bilaterally.  Abdomen Inspection Inspection of the abdomen reveals - No Hernias. Skin - Scar - Note: well healed trocar scars. Palpation/Percussion Palpation and Percussion of the abdomen reveal - Soft, Non Tender, No Rebound tenderness, No Rigidity (guarding) and No hepatosplenomegaly. Auscultation Auscultation of the abdomen reveals - Bowel sounds normal.  Peripheral Vascular Upper Extremity Palpation - Pulses bilaterally normal.  Neurologic Neurologic evaluation reveals -alert and oriented x 3 with no impairment of recent or remote memory. Mental Status-Normal.  Neuropsychiatric The patient's mood and affect are described as -normal. Judgment and Insight-insight is appropriate concerning matters relevant to self.  Musculoskeletal Normal Exam - Left-Upper Extremity Strength Normal and Lower Extremity Strength Normal. Normal Exam - Right-Upper Extremity Strength Normal and Lower Extremity Strength Normal. Note: b/l knee  crepitus  Lymphatic Head & Neck  General Head & Neck Lymphatics: Bilateral - Description - Normal. Axillary - Did not examine. Femoral & Inguinal - Did not examine.  Assessment & Plan Randall Hiss M. Zaleigh Bermingham MD; 04/08/2018 4:32 PM)  MORBID OBESITY (E66.01) Impression: The patient meets weight loss surgery criteria. I think the patient would be an acceptable candidate for Laparoscopic vertical sleeve gastrectomy.  We rediscussed LSG. We rediscussed the preoperative, operative and postoperative process. I discussed the typical recovery. we also discussed the typical issues that we see after surgery.  I spent about 25 minutes answering all of her questions.  She has an issue with Vicryl suture so we will use a Prolene or PDS for the fascial closure site  Current Plans Pt Education - EMW_preopbariatric  PREDIABETES (R73.03)   DEGENERATIVE DISC DISEASE, CERVICAL (M50.30)   GASTROESOPHAGEAL REFLUX DISEASE, ESOPHAGITIS PRESENCE NOT SPECIFIED (K21.9) Impression: did discuss that GERD can worsen after sleeve gastrectomy with potential need for revisionsal surgery   ARTHRITIS OF MULTIPLE SITES (M13.0) Impression: can take nsaids after sleeve as opposed to gastric bypass so another reason she is desiring sleeve gastrectomy.   HISTORY OF ATRIAL FLUTTER (Z86.79)   SLEEP APNEA IN ADULT (G47.30)   HIATAL HERNIA (K44.9) Impression: We discussed the finding of a small hiatal hernia. We discussed hernias. I advised her that we would test for one intraoperatively. If found to have a clinically significant I recommended intraoperative repair. We discussed what that would involve   ADRENAL MASS, RIGHT (E27.8) Impression: stable on repeat CT. c/w benign adenoma  Leighton Ruff. Redmond Pulling, MD, FACS General, Bariatric, & Minimally Invasive Surgery [facility], [place]

## 2018-04-13 NOTE — Progress Notes (Signed)
Anesthesia Chart Review   Case:  [id number] Date/Time:  04/19/18 2876   Procedure:  LAPAROSCOPIC GASTRIC SLEEVE RESECTION WITH UPPER ENDO AND ERAS PATHWAY (N/A )   Anesthesia type:  General   Pre-op diagnosis:  MORBID OBESITY   Location:  [facility] / [facility]   Surgeon:  Greer Pickerel, MD      DISCUSSION: 58 yo former smoker (quit 03/04/79) with h/o A-fib, depression, HLD, OSA on CPAP, morbid obesity scheduled for above surgery on 04/19/18 with Dr. Greer Pickerel.   Pt last seen by cardiology 12/14/17.  Seen by Daune Perch, NP. Per her note, "Pt with hx of afib controlled with flecainide and magnesium. She is low risk for surgery and can proceed with no further cardiac testing. Continue flecainide if possible. Can add BB if needed during perioperative period for control of afib. We are available in the hospital if needed perioperatively.  Can hold aspirin as needed for surgery and resume when safe and OK by surgeon."  Pt can proceed with planned procedure barring acute status change.  VS: BP (!) 141/89   Pulse 70   Temp 36.4 C (Oral)   Resp 18   Ht 5\' 4"  (1.626 m)   Wt 103.4 kg   SpO2 100%   BMI 39.14 kg/m   PROVIDERS: Idelle Crouch, MD is PCP   Cristopher Peru, MD is Cardiologist  LABS: Labs reviewed: Acceptable for surgery. (all labs ordered are listed, but only abnormal results are displayed)  Labs Reviewed  CBC - Abnormal; Notable for the following components:      Result Value   HCT 46.5 (*)    All other components within normal limits  BASIC METABOLIC PANEL     IMAGES:   EKG: 12/14/2017 Rate 77 bpm Normal sinus rhythm  Normal ECG   CV: Stress Test 04/20/2015  There was no ST segment deviation noted during stress.  Negative ETT for ischemia at adequate HR response and workload of 10.1 mets. No chest pain noted.  Normal BP response to exercise.  Echo 12/08/2013 Interpretation  Contraction: normal  Closest EF: >55% (estimated) Size: Normal  Mitral:  trivial MR Tricuspid: trivial TR Size: normal   Carotid Doppler 12/08/2013 Impression:  Minor carotid atherosclerosis.  No hemodynamically significant ICA stenosis by ultrasound.  Degree of narrowing less than 50% bilaterally.  Past Medical History:  Diagnosis Date  . Arrhythmia   . Arthritis   . Atrial fibrillation ([place])   . Chest pain    NM Myocardial Scan 08/31/12   . Chickenpox   . Daytime somnolence   . Depression   . Dysrhythmia   . Hyperlipemia   . Mixed hyperlipidemia   . Ovarian cyst   . Palpitations   . Plantar fasciitis of left foot   . PSVT (paroxysmal supraventricular tachycardia) ([place])   . Sleep apnea    on CPAP    Past Surgical History:  Procedure Laterality Date  . APPENDECTOMY  07-16-14   Dr. Marina Gravel  . BILATERAL KNEE ARTHROSCOPY  05/06/10  . HYSTEROSCOPY  12/2017   in office  . KNEE ARTHROSCOPY Right 2007  . LAPAROSCOPIC APPENDECTOMY N/A 07/16/2014   Procedure: APPENDECTOMY LAPAROSCOPIC;  Surgeon: Sherri Rad, MD;  Location: [facility] ORS;  Service: General;  Laterality: N/A;  . OVARIAN CYST SURGERY  1980  . REPLACEMENT TOTAL KNEE BILATERAL Bilateral   . TONSILLECTOMY     age 78  . TRIGGER FINGER RELEASE Right 06/06/11   Right ring finger  MEDICATIONS: . aspirin EC 81 MG tablet  . Calcium Citrate-Vitamin D (CELEBRATE CALCIUM PLUS 500 PO)  . Cholecalciferol (VITAMIN D3) 2000 UNITS TABS  . clotrimazole-betamethasone (LOTRISONE) cream  . Cyanocobalamin (B-12) 1000 MCG TABS  . flecainide (TAMBOCOR) 100 MG tablet  . ibuprofen (ADVIL,MOTRIN) 200 MG tablet  . Magnesium Oxide (MAG-OXIDE PO)  . Multiple Vitamins-Minerals (CELEBRATE MULTI-COMPLETE 60 PO)  . ranitidine (ZANTAC) 150 MG tablet  . rosuvastatin (CRESTOR) 10 MG tablet  . venlafaxine XR (EFFEXOR-XR) 150 MG 24 hr capsule   No current facility-administered medications for this encounter.     Maia Plan [facility] Pre-Surgical Testing [phone] 04/13/18 3:11 PM

## 2018-04-13 NOTE — Anesthesia Preprocedure Evaluation (Addendum)
Anesthesia Evaluation  Patient identified by MRN, date of birth, ID band Patient awake    Reviewed: Allergy & Precautions, NPO status , Patient's Chart, lab work & pertinent test results  History of Anesthesia Complications Negative for: history of anesthetic complications  Airway Mallampati: II  TM Distance: >3 FB Neck ROM: Full    Dental no notable dental hx. (+) Dental Advisory Given   Pulmonary sleep apnea and Continuous Positive Airway Pressure Ventilation , former smoker,    Pulmonary exam normal        Cardiovascular Normal cardiovascular exam+ dysrhythmias Atrial Fibrillation      Neuro/Psych PSYCHIATRIC DISORDERS Depression negative neurological ROS     GI/Hepatic negative GI ROS, Neg liver ROS,   Endo/Other  Morbid obesity  Renal/GU negative Renal ROS  negative genitourinary   Musculoskeletal negative musculoskeletal ROS (+)   Abdominal   Peds negative pediatric ROS (+)  Hematology negative hematology ROS (+)   Anesthesia Other Findings   Reproductive/Obstetrics negative OB ROS                          Anesthesia Physical Anesthesia Plan  ASA: III  Anesthesia Plan: General   Post-op Pain Management:    Induction: Intravenous  PONV Risk Score and Plan: 3 and Ondansetron, Dexamethasone, Scopolamine patch - Pre-op and Diphenhydramine  Airway Management Planned: Oral ETT  Additional Equipment:   Intra-op Plan:   Post-operative Plan: Extubation in OR  Informed Consent: I have reviewed the patients History and Physical, chart, labs and discussed the procedure including the risks, benefits and alternatives for the proposed anesthesia with the patient or authorized representative who has indicated his/her understanding and acceptance.     Dental advisory given  Plan Discussed with: CRNA and Anesthesiologist  Anesthesia Plan Comments: (See PST note 04/07/18, Konrad Felix, PA-C)       Anesthesia Quick Evaluation

## 2018-04-15 ENCOUNTER — Other Ambulatory Visit: Payer: Self-pay | Admitting: *Deleted

## 2018-04-15 DIAGNOSIS — G4733 Obstructive sleep apnea (adult) (pediatric): Secondary | ICD-10-CM | POA: Diagnosis not present

## 2018-04-15 NOTE — Patient Outreach (Signed)
[facility]) Care Management  04/15/2018  Brianna French [birth year] [id number]   Preoperative Screening Call Referral received: 03/19/18 Surgery date: 04/19/18 Initial Outreach: 04/15/18 Insurance: [place] [employer]    Subjective: Initial unsuccessful telephone call to patient's preferred number in order to complete pre-op screening; no answer, left HIPAA compliant voicemail message requesting return call.   Objective: Per the electronic medical record, [name] is scheduled for gastric sleeve surgery on 04/19/18 at [facility].  She completed her pre-op admission testing visit on 04/07/18 . Comorbidities include: Atrial fibrillation, Obstructive sleep apnea, Adrenal mass, and dyslipidemia   Plan: If patient does not return call, this RNCM will call patient for transition of care outreach within 72 hours of hospital discharge notification.  Barrington Ellison RN,CCM,CDE [place] Management Coordinator Office Phone [phone] Office Fax [phone]

## 2018-04-18 MED ORDER — BUPIVACAINE LIPOSOME 1.3 % IJ SUSP
20.0000 mL | Freq: Once | INTRAMUSCULAR | Status: DC
  Filled 2018-04-18: qty 20

## 2018-04-19 ENCOUNTER — Inpatient Hospital Stay (HOSPITAL_COMMUNITY): Payer: 59 | Admitting: Anesthesiology

## 2018-04-19 ENCOUNTER — Inpatient Hospital Stay (HOSPITAL_COMMUNITY)
Admission: RE | Admit: 2018-04-19 | Discharge: 2018-04-20 | DRG: 620 | Disposition: A | Discharge status: Home / Self Care | Payer: 59 | Attending: General Surgery | Admitting: General Surgery

## 2018-04-19 ENCOUNTER — Encounter (HOSPITAL_COMMUNITY): Payer: Self-pay | Admitting: *Deleted

## 2018-04-19 ENCOUNTER — Encounter (HOSPITAL_COMMUNITY)
Admission: RE | Disposition: A | Discharge status: Home / Self Care | Payer: Self-pay | Source: Home / Self Care | Attending: General Surgery

## 2018-04-19 ENCOUNTER — Inpatient Hospital Stay (HOSPITAL_COMMUNITY): Payer: 59 | Admitting: Physician Assistant

## 2018-04-19 ENCOUNTER — Other Ambulatory Visit: Payer: Self-pay

## 2018-04-19 DIAGNOSIS — Z6837 Body mass index (BMI) 37.0-37.9, adult: Secondary | ICD-10-CM | POA: Diagnosis not present

## 2018-04-19 DIAGNOSIS — E279 Disorder of adrenal gland, unspecified: Secondary | ICD-10-CM | POA: Diagnosis present

## 2018-04-19 DIAGNOSIS — Z91041 Radiographic dye allergy status: Secondary | ICD-10-CM

## 2018-04-19 DIAGNOSIS — E785 Hyperlipidemia, unspecified: Secondary | ICD-10-CM | POA: Diagnosis present

## 2018-04-19 DIAGNOSIS — G4733 Obstructive sleep apnea (adult) (pediatric): Secondary | ICD-10-CM | POA: Diagnosis present

## 2018-04-19 DIAGNOSIS — M159 Polyosteoarthritis, unspecified: Secondary | ICD-10-CM | POA: Diagnosis present

## 2018-04-19 DIAGNOSIS — E669 Obesity, unspecified: Secondary | ICD-10-CM | POA: Diagnosis present

## 2018-04-19 DIAGNOSIS — K449 Diaphragmatic hernia without obstruction or gangrene: Secondary | ICD-10-CM | POA: Diagnosis not present

## 2018-04-19 DIAGNOSIS — I10 Essential (primary) hypertension: Secondary | ICD-10-CM | POA: Diagnosis not present

## 2018-04-19 DIAGNOSIS — Z9109 Other allergy status, other than to drugs and biological substances: Secondary | ICD-10-CM

## 2018-04-19 DIAGNOSIS — N393 Stress incontinence (female) (male): Secondary | ICD-10-CM | POA: Diagnosis present

## 2018-04-19 DIAGNOSIS — Z887 Allergy status to serum and vaccine status: Secondary | ICD-10-CM

## 2018-04-19 DIAGNOSIS — M5030 Other cervical disc degeneration, unspecified cervical region: Secondary | ICD-10-CM | POA: Diagnosis present

## 2018-04-19 DIAGNOSIS — Z79899 Other long term (current) drug therapy: Secondary | ICD-10-CM

## 2018-04-19 DIAGNOSIS — Z7982 Long term (current) use of aspirin: Secondary | ICD-10-CM

## 2018-04-19 DIAGNOSIS — I4892 Unspecified atrial flutter: Secondary | ICD-10-CM | POA: Diagnosis present

## 2018-04-19 DIAGNOSIS — Z8261 Family history of arthritis: Secondary | ICD-10-CM | POA: Diagnosis not present

## 2018-04-19 DIAGNOSIS — K219 Gastro-esophageal reflux disease without esophagitis: Secondary | ICD-10-CM | POA: Diagnosis present

## 2018-04-19 DIAGNOSIS — Z96659 Presence of unspecified artificial knee joint: Secondary | ICD-10-CM | POA: Diagnosis present

## 2018-04-19 DIAGNOSIS — Z818 Family history of other mental and behavioral disorders: Secondary | ICD-10-CM

## 2018-04-19 DIAGNOSIS — R7303 Prediabetes: Secondary | ICD-10-CM | POA: Diagnosis present

## 2018-04-19 DIAGNOSIS — Z833 Family history of diabetes mellitus: Secondary | ICD-10-CM | POA: Diagnosis not present

## 2018-04-19 DIAGNOSIS — I471 Supraventricular tachycardia: Secondary | ICD-10-CM | POA: Diagnosis present

## 2018-04-19 DIAGNOSIS — Z9884 Bariatric surgery status: Secondary | ICD-10-CM

## 2018-04-19 DIAGNOSIS — Z841 Family history of disorders of kidney and ureter: Secondary | ICD-10-CM

## 2018-04-19 DIAGNOSIS — Z8249 Family history of ischemic heart disease and other diseases of the circulatory system: Secondary | ICD-10-CM

## 2018-04-19 DIAGNOSIS — Z823 Family history of stroke: Secondary | ICD-10-CM

## 2018-04-19 HISTORY — PX: LAPAROSCOPIC GASTRIC SLEEVE RESECTION WITH HIATAL HERNIA REPAIR: SHX6512

## 2018-04-19 HISTORY — PX: UPPER GI ENDOSCOPY: SHX6162

## 2018-04-19 LAB — HEMOGLOBIN AND HEMATOCRIT, BLOOD
HCT: 44.5 % (ref 36.0–46.0)
Hemoglobin: 13.9 g/dL (ref 12.0–15.0)

## 2018-04-19 SURGERY — GASTRECTOMY, SLEEVE, LAPAROSCOPIC, WITH HIATAL HERNIA REPAIR
Anesthesia: General | Site: Abdomen

## 2018-04-19 MED ORDER — KCL IN DEXTROSE-NACL 20-5-0.45 MEQ/L-%-% IV SOLN
INTRAVENOUS | Status: DC
  Administered 2018-04-19 – 2018-04-20 (×3): via INTRAVENOUS
  Filled 2018-04-19 (×3): qty 1000

## 2018-04-19 MED ORDER — SUGAMMADEX SODIUM 200 MG/2ML IV SOLN
INTRAVENOUS | Status: DC | PRN
  Administered 2018-04-19: 250 mg via INTRAVENOUS

## 2018-04-19 MED ORDER — EPHEDRINE 5 MG/ML INJ
INTRAVENOUS | Status: AC
  Filled 2018-04-19: qty 10

## 2018-04-19 MED ORDER — MIDAZOLAM HCL 2 MG/2ML IJ SOLN
INTRAMUSCULAR | Status: AC
  Filled 2018-04-19: qty 2

## 2018-04-19 MED ORDER — PROPOFOL 10 MG/ML IV BOLUS
INTRAVENOUS | Status: AC
  Filled 2018-04-19: qty 40

## 2018-04-19 MED ORDER — SUCCINYLCHOLINE CHLORIDE 200 MG/10ML IV SOSY
PREFILLED_SYRINGE | INTRAVENOUS | Status: AC
  Filled 2018-04-19: qty 10

## 2018-04-19 MED ORDER — MIDAZOLAM HCL 5 MG/5ML IJ SOLN
INTRAMUSCULAR | Status: DC | PRN
  Administered 2018-04-19: 2 mg via INTRAVENOUS

## 2018-04-19 MED ORDER — LIDOCAINE 2% (20 MG/ML) 5 ML SYRINGE
INTRAMUSCULAR | Status: AC
  Filled 2018-04-19: qty 5

## 2018-04-19 MED ORDER — ONDANSETRON HCL 4 MG/2ML IJ SOLN
INTRAMUSCULAR | Status: AC
  Filled 2018-04-19: qty 2

## 2018-04-19 MED ORDER — LIDOCAINE 2% (20 MG/ML) 5 ML SYRINGE
INTRAMUSCULAR | Status: DC | PRN
  Administered 2018-04-19: 100 mg via INTRAVENOUS

## 2018-04-19 MED ORDER — KETAMINE HCL 10 MG/ML IJ SOLN
INTRAMUSCULAR | Status: DC | PRN
  Administered 2018-04-19: 30 mg via INTRAVENOUS

## 2018-04-19 MED ORDER — KETOROLAC TROMETHAMINE 30 MG/ML IJ SOLN
INTRAMUSCULAR | Status: DC | PRN
  Administered 2018-04-19: 30 mg via INTRAVENOUS

## 2018-04-19 MED ORDER — DIPHENHYDRAMINE HCL 50 MG/ML IJ SOLN: 12.5000 mg | Freq: Three times a day (TID) | INTRAMUSCULAR | Status: DC | PRN

## 2018-04-19 MED ORDER — ROCURONIUM BROMIDE 50 MG/5ML IV SOSY
PREFILLED_SYRINGE | INTRAVENOUS | Status: DC | PRN
  Administered 2018-04-19: 60 mg via INTRAVENOUS

## 2018-04-19 MED ORDER — ENOXAPARIN SODIUM 30 MG/0.3ML ~~LOC~~ SOLN
30.0000 mg | Freq: Two times a day (BID) | SUBCUTANEOUS | Status: DC
  Administered 2018-04-19 – 2018-04-20 (×2): 30 mg via SUBCUTANEOUS
  Filled 2018-04-19 (×2): qty 0.3

## 2018-04-19 MED ORDER — PROMETHAZINE HCL 25 MG/ML IJ SOLN: 12.5000 mg | Freq: Four times a day (QID) | INTRAMUSCULAR | Status: DC | PRN

## 2018-04-19 MED ORDER — GABAPENTIN 100 MG PO CAPS
200.0000 mg | ORAL_CAPSULE | Freq: Two times a day (BID) | ORAL | Status: DC
  Administered 2018-04-19 – 2018-04-20 (×2): 200 mg via ORAL
  Filled 2018-04-19 (×2): qty 2

## 2018-04-19 MED ORDER — EPHEDRINE SULFATE-NACL 50-0.9 MG/10ML-% IV SOSY
PREFILLED_SYRINGE | INTRAVENOUS | Status: DC | PRN
  Administered 2018-04-19 (×2): 5 mg via INTRAVENOUS
  Administered 2018-04-19 (×2): 10 mg via INTRAVENOUS

## 2018-04-19 MED ORDER — OXYCODONE HCL 5 MG/5ML PO SOLN
5.0000 mg | ORAL | Status: DC | PRN
  Administered 2018-04-19: 5 mg via ORAL
  Filled 2018-04-19: qty 5

## 2018-04-19 MED ORDER — LACTATED RINGERS IR SOLN
Status: DC | PRN
  Administered 2018-04-19: 1000 mL

## 2018-04-19 MED ORDER — ROCURONIUM BROMIDE 100 MG/10ML IV SOLN
INTRAVENOUS | Status: AC
  Filled 2018-04-19: qty 1

## 2018-04-19 MED ORDER — ONDANSETRON HCL 4 MG/2ML IJ SOLN
4.0000 mg | Freq: Four times a day (QID) | INTRAMUSCULAR | Status: DC | PRN
  Administered 2018-04-19: 4 mg via INTRAVENOUS
  Filled 2018-04-19: qty 2

## 2018-04-19 MED ORDER — CHLORHEXIDINE GLUCONATE 4 % EX LIQD: 60.0000 mL | Freq: Once | CUTANEOUS | Status: DC

## 2018-04-19 MED ORDER — SODIUM CHLORIDE 0.9 % IV SOLN
2.0000 g | INTRAVENOUS | Status: AC
  Administered 2018-04-19: 2 g via INTRAVENOUS
  Filled 2018-04-19: qty 2

## 2018-04-19 MED ORDER — GABAPENTIN 300 MG PO CAPS
300.0000 mg | ORAL_CAPSULE | ORAL | Status: AC
  Administered 2018-04-19: 300 mg via ORAL
  Filled 2018-04-19: qty 1

## 2018-04-19 MED ORDER — LACTATED RINGERS IV SOLN
INTRAVENOUS | Status: DC
  Administered 2018-04-19 (×2): via INTRAVENOUS

## 2018-04-19 MED ORDER — ENSURE MAX PROTEIN PO LIQD
2.0000 [oz_av] | ORAL | Status: DC
  Administered 2018-04-20 (×5): 2 [oz_av] via ORAL

## 2018-04-19 MED ORDER — SUGAMMADEX SODIUM 500 MG/5ML IV SOLN
INTRAVENOUS | Status: AC
  Filled 2018-04-19: qty 5

## 2018-04-19 MED ORDER — APREPITANT 40 MG PO CAPS
40.0000 mg | ORAL_CAPSULE | ORAL | Status: AC
  Administered 2018-04-19: 40 mg via ORAL
  Filled 2018-04-19: qty 1

## 2018-04-19 MED ORDER — SODIUM CHLORIDE (PF) 0.9 % IJ SOLN
INTRAMUSCULAR | Status: AC
  Filled 2018-04-19: qty 50

## 2018-04-19 MED ORDER — DEXAMETHASONE SODIUM PHOSPHATE 10 MG/ML IJ SOLN
INTRAMUSCULAR | Status: DC | PRN
  Administered 2018-04-19: 4 mg via INTRAVENOUS

## 2018-04-19 MED ORDER — DEXAMETHASONE SODIUM PHOSPHATE 4 MG/ML IJ SOLN: 4.0000 mg | INTRAMUSCULAR | Status: DC

## 2018-04-19 MED ORDER — ACETAMINOPHEN 500 MG PO TABS
1000.0000 mg | ORAL_TABLET | ORAL | Status: AC
  Administered 2018-04-19: 1000 mg via ORAL
  Filled 2018-04-19: qty 2

## 2018-04-19 MED ORDER — FENTANYL CITRATE (PF) 250 MCG/5ML IJ SOLN
INTRAMUSCULAR | Status: AC
  Filled 2018-04-19: qty 5

## 2018-04-19 MED ORDER — DEXAMETHASONE SODIUM PHOSPHATE 10 MG/ML IJ SOLN
INTRAMUSCULAR | Status: AC
  Filled 2018-04-19: qty 1

## 2018-04-19 MED ORDER — FENTANYL CITRATE (PF) 250 MCG/5ML IJ SOLN
INTRAMUSCULAR | Status: DC | PRN
  Administered 2018-04-19: 50 ug via INTRAVENOUS

## 2018-04-19 MED ORDER — SCOPOLAMINE 1 MG/3DAYS TD PT72
1.0000 | MEDICATED_PATCH | TRANSDERMAL | Status: DC
  Administered 2018-04-19: 1.5 mg via TRANSDERMAL

## 2018-04-19 MED ORDER — SODIUM CHLORIDE (PF) 0.9 % IJ SOLN
INTRAMUSCULAR | Status: DC | PRN
  Administered 2018-04-19: 50 mL via INTRAVENOUS

## 2018-04-19 MED ORDER — PHENYLEPHRINE 40 MCG/ML (10ML) SYRINGE FOR IV PUSH (FOR BLOOD PRESSURE SUPPORT)
PREFILLED_SYRINGE | INTRAVENOUS | Status: DC | PRN
  Administered 2018-04-19 (×4): 80 ug via INTRAVENOUS

## 2018-04-19 MED ORDER — PROPOFOL 10 MG/ML IV BOLUS
INTRAVENOUS | Status: DC | PRN
  Administered 2018-04-19: 200 mg via INTRAVENOUS

## 2018-04-19 MED ORDER — PROMETHAZINE HCL 25 MG/ML IJ SOLN: 6.2500 mg | INTRAMUSCULAR | Status: DC | PRN

## 2018-04-19 MED ORDER — SCOPOLAMINE 1 MG/3DAYS TD PT72
1.0000 | MEDICATED_PATCH | TRANSDERMAL | Status: DC
  Filled 2018-04-19: qty 1

## 2018-04-19 MED ORDER — LIDOCAINE 2% (20 MG/ML) 5 ML SYRINGE
INTRAMUSCULAR | Status: DC | PRN
  Administered 2018-04-19: 1.5 mg/kg/h via INTRAVENOUS

## 2018-04-19 MED ORDER — MORPHINE SULFATE (PF) 4 MG/ML IV SOLN
1.0000 mg | INTRAVENOUS | Status: DC | PRN
  Administered 2018-04-19: 2 mg via INTRAVENOUS
  Filled 2018-04-19: qty 1

## 2018-04-19 MED ORDER — SIMETHICONE 80 MG PO CHEW: 80.0000 mg | CHEWABLE_TABLET | Freq: Four times a day (QID) | ORAL | Status: DC | PRN

## 2018-04-19 MED ORDER — 0.9 % SODIUM CHLORIDE (POUR BTL) OPTIME
TOPICAL | Status: DC | PRN
  Administered 2018-04-19: 1000 mL

## 2018-04-19 MED ORDER — KETOROLAC TROMETHAMINE 15 MG/ML IJ SOLN
15.0000 mg | Freq: Three times a day (TID) | INTRAMUSCULAR | Status: DC
  Administered 2018-04-19 – 2018-04-20 (×3): 15 mg via INTRAVENOUS
  Filled 2018-04-19 (×3): qty 1

## 2018-04-19 MED ORDER — SUCCINYLCHOLINE CHLORIDE 200 MG/10ML IV SOSY
PREFILLED_SYRINGE | INTRAVENOUS | Status: DC | PRN
  Administered 2018-04-19: 120 mg via INTRAVENOUS

## 2018-04-19 MED ORDER — FENTANYL CITRATE (PF) 100 MCG/2ML IJ SOLN
25.0000 ug | INTRAMUSCULAR | Status: DC | PRN
  Administered 2018-04-19 (×2): 50 ug via INTRAVENOUS

## 2018-04-19 MED ORDER — ACETAMINOPHEN 160 MG/5ML PO SOLN
650.0000 mg | Freq: Four times a day (QID) | ORAL | Status: DC
  Administered 2018-04-19 – 2018-04-20 (×3): 650 mg via ORAL
  Filled 2018-04-19 (×3): qty 20.3

## 2018-04-19 MED ORDER — PANTOPRAZOLE SODIUM 40 MG IV SOLR
40.0000 mg | Freq: Every day | INTRAVENOUS | Status: DC
  Administered 2018-04-19: 40 mg via INTRAVENOUS
  Filled 2018-04-19 (×2): qty 40

## 2018-04-19 MED ORDER — HEPARIN SODIUM (PORCINE) 5000 UNIT/ML IJ SOLN
5000.0000 [IU] | INTRAMUSCULAR | Status: AC
  Administered 2018-04-19: 5000 [IU] via SUBCUTANEOUS
  Filled 2018-04-19: qty 1

## 2018-04-19 MED ORDER — FLECAINIDE ACETATE 100 MG PO TABS
100.0000 mg | ORAL_TABLET | Freq: Two times a day (BID) | ORAL | Status: DC
  Administered 2018-04-20: 100 mg via ORAL
  Filled 2018-04-19 (×2): qty 1

## 2018-04-19 MED ORDER — ONDANSETRON HCL 4 MG/2ML IJ SOLN
INTRAMUSCULAR | Status: DC | PRN
  Administered 2018-04-19: 4 mg via INTRAVENOUS

## 2018-04-19 MED ORDER — BUPIVACAINE LIPOSOME 1.3 % IJ SUSP
INTRAMUSCULAR | Status: DC | PRN
  Administered 2018-04-19: 20 mL

## 2018-04-19 MED ORDER — FENTANYL CITRATE (PF) 100 MCG/2ML IJ SOLN
INTRAMUSCULAR | Status: AC
  Filled 2018-04-19: qty 2

## 2018-04-19 SURGICAL SUPPLY — 72 items
APPLICATOR COTTON TIP 6 STRL (MISCELLANEOUS) IMPLANT
APPLICATOR COTTON TIP 6IN STRL (MISCELLANEOUS)
APPLIER CLIP ROT 10 11.4 M/L (STAPLE)
APPLIER CLIP ROT 13.4 12 LRG (CLIP)
BANDAGE ADH SHEER 1  50/CT (GAUZE/BANDAGES/DRESSINGS) ×30 IMPLANT
BENZOIN TINCTURE PRP APPL 2/3 (GAUZE/BANDAGES/DRESSINGS) ×5 IMPLANT
BLADE SURG SZ11 CARB STEEL (BLADE) ×5 IMPLANT
CABLE HIGH FREQUENCY MONO STRZ (ELECTRODE) IMPLANT
CHLORAPREP W/TINT 26ML (MISCELLANEOUS) ×10 IMPLANT
CLIP APPLIE ROT 10 11.4 M/L (STAPLE) IMPLANT
CLIP APPLIE ROT 13.4 12 LRG (CLIP) IMPLANT
CLOSURE WOUND 1/2 X4 (GAUZE/BANDAGES/DRESSINGS) ×1
COVER SURGICAL LIGHT HANDLE (MISCELLANEOUS) ×5 IMPLANT
COVER WAND RF STERILE (DRAPES) ×5 IMPLANT
DECANTER SPIKE VIAL GLASS SM (MISCELLANEOUS) ×5 IMPLANT
DEVICE SUT QUICK LOAD TK 5 (STAPLE) ×8 IMPLANT
DEVICE SUT TI-KNOT TK 5X26 (MISCELLANEOUS) ×4 IMPLANT
DEVICE SUTURE ENDOST 10MM (ENDOMECHANICALS) ×5 IMPLANT
DEVICE TI KNOT TK5 (MISCELLANEOUS) ×1
DISSECTOR BLUNT TIP ENDO 5MM (MISCELLANEOUS) IMPLANT
DRAPE UTILITY XL STRL (DRAPES) ×10 IMPLANT
ELECT L-HOOK LAP 45CM DISP (ELECTROSURGICAL)
ELECT PENCIL ROCKER SW 15FT (MISCELLANEOUS) IMPLANT
ELECT REM PT RETURN 15FT ADLT (MISCELLANEOUS) ×5 IMPLANT
ELECTRODE L-HOOK LAP 45CM DISP (ELECTROSURGICAL) IMPLANT
GAUZE SPONGE 2X2 8PLY STRL LF (GAUZE/BANDAGES/DRESSINGS) IMPLANT
GAUZE SPONGE 4X4 12PLY STRL (GAUZE/BANDAGES/DRESSINGS) IMPLANT
GLOVE BIO SURGEON STRL SZ7.5 (GLOVE) ×5 IMPLANT
GLOVE INDICATOR 8.0 STRL GRN (GLOVE) ×5 IMPLANT
GOWN STRL REUS W/TWL XL LVL3 (GOWN DISPOSABLE) ×25 IMPLANT
GRASPER SUT TROCAR 14GX15 (MISCELLANEOUS) ×5 IMPLANT
HOVERMATT SINGLE USE (MISCELLANEOUS) ×5 IMPLANT
KIT BASIN OR (CUSTOM PROCEDURE TRAY) ×5 IMPLANT
MARKER SKIN DUAL TIP RULER LAB (MISCELLANEOUS) ×5 IMPLANT
NEEDLE SPNL 22GX3.5 QUINCKE BK (NEEDLE) ×5 IMPLANT
PACK UNIVERSAL I (CUSTOM PROCEDURE TRAY) ×5 IMPLANT
PENCIL HANDSWITCHING (ELECTRODE) ×5 IMPLANT
QUICK LOAD TK 5 (STAPLE) ×2
RELOAD STAPLER 60MM BLK (STAPLE) IMPLANT
RELOAD STAPLER BLUE 60MM (STAPLE) ×12 IMPLANT
RELOAD STAPLER GOLD 60MM (STAPLE) ×3 IMPLANT
RELOAD STAPLER GREEN 60MM (STAPLE) ×3 IMPLANT
SCISSORS LAP 5X45 EPIX DISP (ENDOMECHANICALS) IMPLANT
SEALANT SURGICAL APPL DUAL CAN (MISCELLANEOUS) IMPLANT
SET IRRIG TUBING LAPAROSCOPIC (IRRIGATION / IRRIGATOR) ×5 IMPLANT
SET TUBE SMOKE EVAC HIGH FLOW (TUBING) ×5 IMPLANT
SHEARS HARMONIC ACE PLUS 45CM (MISCELLANEOUS) ×5 IMPLANT
SLEEVE GASTRECTOMY 40FR VISIGI (MISCELLANEOUS) ×5 IMPLANT
SLEEVE XCEL OPT CAN 5 100 (ENDOMECHANICALS) ×15 IMPLANT
SOLUTION ANTI FOG 6CC (MISCELLANEOUS) ×5 IMPLANT
SPONGE GAUZE 2X2 STER 10/PKG (GAUZE/BANDAGES/DRESSINGS)
SPONGE LAP 18X18 RF (DISPOSABLE) ×5 IMPLANT
STAPLER ECHELON BIOABSB 60 FLE (MISCELLANEOUS) ×25 IMPLANT
STAPLER ECHELON LONG 60 440 (INSTRUMENTS) ×5 IMPLANT
STAPLER RELOAD 60MM BLK (STAPLE)
STAPLER RELOAD BLUE 60MM (STAPLE) ×20
STAPLER RELOAD GOLD 60MM (STAPLE) ×5
STAPLER RELOAD GREEN 60MM (STAPLE) ×5
STRIP CLOSURE SKIN 1/2X4 (GAUZE/BANDAGES/DRESSINGS) ×4 IMPLANT
SUT MNCRL AB 4-0 PS2 18 (SUTURE) ×10 IMPLANT
SUT PROLENE 0 CT 2 (SUTURE) ×5 IMPLANT
SUT SURGIDAC NAB ES-9 0 48 120 (SUTURE) ×10 IMPLANT
SUT VICRYL 0 TIES 12 18 (SUTURE) IMPLANT
SYR 20CC LL (SYRINGE) ×5 IMPLANT
SYR 50ML LL SCALE MARK (SYRINGE) ×5 IMPLANT
TOWEL OR 17X26 10 PK STRL BLUE (TOWEL DISPOSABLE) ×5 IMPLANT
TOWEL OR NON WOVEN STRL DISP B (DISPOSABLE) ×5 IMPLANT
TROCAR BLADELESS 15MM (ENDOMECHANICALS) ×5 IMPLANT
TROCAR BLADELESS OPT 5 100 (ENDOMECHANICALS) ×5 IMPLANT
TUBING CONNECTING 10 (TUBING) ×4 IMPLANT
TUBING CONNECTING 10' (TUBING) ×1
TUBING ENDO SMARTCAP (MISCELLANEOUS) ×5 IMPLANT

## 2018-04-19 NOTE — Interval H&P Note (Signed)
History and Physical Interval Note:  04/19/2018 9:27 AM  Brianna French  has presented today for surgery, with the diagnosis of MORBID OBESITY  The various methods of treatment have been discussed with the patient and family. After consideration of risks, benefits and other options for treatment, the patient has consented to  Procedure(s): LAPAROSCOPIC GASTRIC SLEEVE RESECTION WITH UPPER ENDO AND ERAS PATHWAY (N/A) as a surgical intervention .  The patient's history has been reviewed, patient examined, no change in status, stable for surgery.  I have reviewed the patient's chart and labs.  Questions were answered to the patient's satisfaction.    Possible hiatal hernia repair  Greer Pickerel

## 2018-04-19 NOTE — Op Note (Signed)
Preoperative diagnosis: laparoscopic sleeve gastrectomy  Postoperative diagnosis: Same   Procedure: Upper endoscopy   Surgeon: Gurney Maxin, M.D.  Anesthesia: Gen.   Indications for procedure: This patient was undergoing a laparoscopic sleeve gastrectomy.   Description of procedure: The endoscopy was placed in the mouth and into the oropharynx and under endoscopic vision it was advanced to the esophagogastric junction. The pouch was insufflated and no bleeding or bubbles were seen. The GEJ was identified at 39cm from the teeth. No bleeding or leaks were detected. The scope was withdrawn without difficulty.   Gurney Maxin, M.D. General, Bariatric, & Minimally Invasive Surgery [facility] [place]

## 2018-04-19 NOTE — Progress Notes (Signed)
Upon arrival to unit, patient ambulated from stretcher in hall to chair in room.  Donne Hazel, RN

## 2018-04-19 NOTE — Progress Notes (Signed)
PHARMACY CONSULT FOR:  Risk Assessment for Post-Discharge VTE Following Bariatric Surgery  Post-Discharge VTE Risk Assessment: This patient's probability of 30-day post-discharge VTE is increased due to the factors marked:   Female    Age >/=60 years    BMI >/=50 kg/m2    CHF    Dyspnea at Rest    Paraplegia   x Non-gastric-band surgery    Operation Time >/=3 hr    Return to OR     Length of Stay >/= 3 d   Predicted probability of 30-day post-discharge VTE: 0.16%  Other patient-specific factors to consider:   Recommendation for Discharge: No pharmacologic prophylaxis post-discharge  Brianna French is a 58 y.o. female who underwent  laparoscopic sleeve gastrectomy on 2/17   Case start: 1014 Case end: 1125   Allergies  Allergen Reactions  . Ivp Dye [Iodinated Diagnostic Agents] Other (See Comments)    ALSO VICRYL CAUSES OPEN SORES/BLISTERS  . Other Other (See Comments)    Vicryl suture causes sores/blisters, narcotics make her sick  . Pneumococcal Vac Polyvalent Other (See Comments)    Fever, edema  . Pneumococcal Vaccine Other (See Comments)    Fever, edema    Patient Measurements:   There is no height or weight on file to calculate BMI.  Recent Labs    04/19/18 1157  HGB 13.9  HCT 44.5   Estimated Creatinine Clearance: 88.7 mL/min (by C-G formula based on SCr of 0.76 mg/dL).    Past Medical History:  Diagnosis Date  . Arrhythmia   . Arthritis   . Atrial fibrillation ([place])   . Chest pain    NM Myocardial Scan 08/31/12   . Chickenpox   . Daytime somnolence   . Depression   . Dysrhythmia   . Hyperlipemia   . Mixed hyperlipidemia   . Ovarian cyst   . Palpitations   . Plantar fasciitis of left foot   . PSVT (paroxysmal supraventricular tachycardia) ([place])   . Sleep apnea    on CPAP     Medications Prior to Admission  Medication Sig Dispense Refill Last Dose  . aspirin EC 81 MG tablet Take 1 tablet (81 mg total) by mouth daily. 90 tablet 3  04/05/2018  . Calcium Citrate-Vitamin D (CELEBRATE CALCIUM PLUS 500 PO) Take 500 mg by mouth 3 (three) times daily.   04/18/2018 at Unknown time  . Cholecalciferol (VITAMIN D3) 2000 UNITS TABS Take 2,000 Units by mouth daily.    04/18/2018 at Unknown time  . clotrimazole-betamethasone (LOTRISONE) cream Apply 1 application topically daily as needed (rash). APPLY A SMALL AMOUNT TO AFFECTED AREA 2 TIMES A DAY AS DIRECTED   Taking  . Cyanocobalamin (B-12) 1000 MCG TABS Take 1,000 mcg by mouth daily.    04/18/2018 at Unknown time  . flecainide (TAMBOCOR) 100 MG tablet TAKE 1 TABLET BY MOUTH 2 TIMES DAILY. (Patient taking differently: Take 100 mg by mouth 2 (two) times daily. ) 180 tablet 0 04/19/2018 at 0600  . ibuprofen (ADVIL,MOTRIN) 200 MG tablet Take 800 mg by mouth every 6 (six) hours as needed for moderate pain.   Taking  . Magnesium Oxide (MAG-OXIDE PO) Take 800 mg by mouth every morning.    04/18/2018 at Unknown time  . Multiple Vitamins-Minerals (CELEBRATE MULTI-COMPLETE 60 PO) Take 1 tablet by mouth 2 (two) times daily.   04/18/2018 at Unknown time  . rosuvastatin (CRESTOR) 10 MG tablet Take 10 mg by mouth every morning. TAKE ONE TABLET BY MOUTH IN THE MORNING  04/19/2018 at 0600  . venlafaxine XR (EFFEXOR-XR) 150 MG 24 hr capsule Take 150 mg by mouth daily with breakfast.   3 04/19/2018 at 0600  . ranitidine (ZANTAC) 150 MG tablet Take 150 mg by mouth daily as needed for heartburn.   Not Taking       Napoleon Form 04/19/2018,12:39 PM

## 2018-04-19 NOTE — Anesthesia Postprocedure Evaluation (Signed)
Anesthesia Post Note  Patient: Brianna French  Procedure(s) Performed: LAPAROSCOPIC GASTRIC SLEEVE RESECTION WITH HIATAL HERNIA REPAIR (Abdomen) UPPER GI ENDOSCOPY     Patient location during evaluation: PACU Anesthesia Type: General Level of consciousness: sedated Pain management: pain level controlled Vital Signs Assessment: post-procedure vital signs reviewed and stable Respiratory status: spontaneous breathing and respiratory function stable Cardiovascular status: stable Postop Assessment: no apparent nausea or vomiting Anesthetic complications: no    Last Vitals:  Vitals:   04/19/18 1215 04/19/18 1249  BP: (!) 153/86 (!) 149/81  Pulse: 75 77  Resp: 12 16  Temp: (!) 36.4 C (!) 36.3 C  SpO2: 96% 99%    Last Pain:  Vitals:   04/19/18 1249  TempSrc: Oral  PainSc:                  [name] DANIEL

## 2018-04-19 NOTE — Op Note (Signed)
04/19/2018 Brianna French 04/18/1960 [id number]   PRE-OPERATIVE DIAGNOSIS:     Obesity (BMI 37)   Dyslipidemia   Obstructive sleep apnea   Essential hypertension   Osteoarthritis of multiple joints   Hiatal hernia  POST-OPERATIVE DIAGNOSIS:  same  PROCEDURE:  Procedure(s): LAPAROSCOPIC SLEEVE GASTRECTOMY WITH HIATAL HERNIA REPAIR UPPER GI ENDOSCOPY  SURGEON:  Surgeon(s): Gayland Curry, MD FACS FASMBS  ASSISTANTS: Gurney Maxin MD FACS  ANESTHESIA:   general  DRAINS: none   BOUGIE: 40 fr ViSiGi  LOCAL MEDICATIONS USED:   Exparel  EBL: minimal  SPECIMEN:  Source of Specimen:  Greater curvature of stomach  DISPOSITION OF SPECIMEN:  PATHOLOGY  COUNTS:  YES  INDICATION FOR PROCEDURE: This is a very pleasant 59 y.o.-year-old morbidly obese female who has had unsuccessful attempts for sustained weight loss. The patient presents today for a planned laparoscopic sleeve gastrectomy with upper endoscopy. We have discussed the risk and benefits of the procedure extensively preoperatively. Please see my separate notes.  PROCEDURE: After obtaining informed consent and receiving 5000 units of subcutaneous heparin, the patient was brought to the operating room at [facility] and placed supine on the operating room table. General endotracheal anesthesia was established. Sequential compression devices were placed. A orogastric tube was placed. The patient's abdomen was prepped and draped in the usual standard surgical fashion. The patient received preoperative IV antibiotic. A surgical timeout was performed. ERAS protocol used.   Access to the abdomen was achieved using a 5 mm 0 laparoscope thru a 5 mm trocar In the left upper Quadrant 2 fingerbreadths below the left subcostal margin using the Optiview technique. Pneumoperitoneum was smoothly established up to 15 mm of mercury. The laparoscope was advanced and the abdominal cavity was surveilled. The patient was then placed in  reverse Trendelenburg.   A 5 mm trocar was placed slightly above and to the left of the umbilicus under direct visualization.  The [facility] liver retractor was placed under the left lobe of the liver through a 5 mm trocar incision site in the subxiphoid position. A 5 mm trocar was placed in the lateral right upper quadrant along with a 15 mm trocar in the mid right abdomen. A final 5 mm trocar was placed in the lateral LUQ.  All under direct visualization after exparel had been infiltrated in bilateral lateral upper abdominal walls as a TAP block.  The stomach was inspected. It was completely decompressed and the orogastric tube was removed.  There is a small anterior dimple that was obviously visible. The calibration tube was placed in the oropharynx and guided down into the stomach by the CRNA. 10 mL of air was insufflated into the calibration balloon. The calibration tubing was then gently pulled back by the CRNA and it slid past the GE junction. At this point the calibration tubing was desufflated and pulled back into the esophagus. This confirmed my suspicion of a clinically significant hiatal hernia. The gastrohepatic ligament was incised with harmonic scalpel. The right crus was identified. We identified the crossing fat along the right crus. The adipose tissue just above this area was incised with harmonic scalpel. I then bluntly dissected out this area and identified the left crus. There was evidence of a hiatal hernia. I then mobilized the esophagus. The left and right crus were further mobilized with blunt dissection. I was then able to reapproximate the left and right crus with 0 Ethibond using an Endostitch suture device and securing it with a titanium tyknot.  I placed a second suture in a similar fashion. We then had the CRNA readvanced the calibration tubing back into the stomach. 10 mL of air was insufflated into the calibration tube balloon. The calibration tube was then gently pulled back and  there was resistance at the GE junction. The tube did not slide back up into the esophagus. At this point the calibration tubing was deflated and removed from the patient's body.   We identified the pylorus and measured 6 cm proximal to the pylorus and identified an area of where we would start taking down the short gastric vessels. Harmonic scalpel was used to take down the short gastric vessels along the greater curvature of the stomach. We were able to enter the lesser sac. We continued to march along the greater curvature of the stomach taking down the short gastrics. As we approached the gastrosplenic ligament we took care in this area not to injure the spleen. We were able to take down the entire gastrosplenic ligament. We then mobilized the fundus away from the left crus of diaphragm. There were some significant posterior gastric avascular attachments which were taken down. This left the stomach completely mobilized. No vessels had been taken down along the lesser curvature of the stomach.  We then reidentified the pylorus. A 40Fr ViSiGi was then placed in the oropharynx and advanced down into the stomach and placed in the distal antrum and positioned along the lesser curvature. It was placed under suction which secured the 40Fr ViSiGi in place along the lesser curve. Then using the Ethicon echelon 60 mm stapler with a green load with Seamguard, I placed a stapler along the antrum approximately 5 cm from the pylorus. The stapler was angled so that there is ample room at the angularis incisura. I then fired the first staple load after inspecting it posteriorly to ensure adequate space both anteriorly and posteriorly. At this point I still was not completely past the angularis so with a gold load with Seamguard, I placed the stapler in position just inside the prior stapleline. We then rotated the stomach to insure that there was adequate anteriorly as well as posteriorly. The stapler was then fired.  At  this point I started using 60 mm blue load staple cartridges with Seamguard. The echelon stapler was then repositioned with a 60 mm blue load with Seamguard and we continued to march up along the [place]. My assistant was holding traction along the greater curvature stomach along the cauterized short gastric vessels ensuring that the stomach was symmetrically retracted. Prior to each firing of the staple, we rotated the stomach to ensure that there is adequate stomach left.  As we approached the fundus, I used 60 mm blue cartridge with Seamguard aiming  lateral to the GE junction after mobilizing some of the esophageal fat pad.  The sleeve was inspected. There is no evidence of cork screw. The staple line appeared hemostatic. The CRNA inflated the ViSiGi to the green zone and the upper abdomen was flooded with saline. There were no bubbles. The sleeve was decompressed and the ViSiGi removed. My assistant scrubbed out and performed an upper endoscopy. The sleeve easily distended with air and the scope was easily advanced to the pylorus. There is no evidence of internal bleeding or cork screwing. There was no narrowing at the angularis. There is no evidence of bubbles. Please see his operative note for further details. The gastric sleeve was decompressed and the endoscope was removed.  The greater curvature the  stomach was grasped with a laparoscopic grasper and removed from the 15 mm trocar site.  The liver retractor was removed. I then closed the 15 mm trocar site with 1 interrupted 0 Prolene sutures through the fascia using the endoclose. The closure was viewed laparoscopically and it was airtight. Remaining Exparel was then infiltrated in the preperitoneal spaces around the trocar sites. Pneumoperitoneum was released. All trocar sites were closed with a 4-0 Monocryl in a subcuticular fashion followed by the application of benzoin, steri-strips, and bandaids. The patient was extubated and taken to the recovery room  in stable condition. All needle, instrument, and sponge counts were correct x2. There are no immediate complications  (1) 60 mm green with Seamguard (1) 60 mm gold with seamguard (3) 60 mm blue with 2 seamguard  PLAN OF CARE: Admit to inpatient   PATIENT DISPOSITION:  PACU - hemodynamically stable.   Delay start of Pharmacological VTE agent (>24hrs) due to surgical blood loss or risk of bleeding:  no  Leighton Ruff. Redmond Pulling, MD, FACS FASMBS General, Bariatric, & Minimally Invasive Surgery [facility], [place]

## 2018-04-19 NOTE — Anesthesia Procedure Notes (Signed)
Procedure Name: Intubation Date/Time: 04/19/2018 9:57 AM Performed by: Maxwell Caul, CRNA Pre-anesthesia Checklist: Patient identified, Emergency Drugs available, Suction available and Patient being monitored Patient Re-evaluated:Patient Re-evaluated prior to induction Oxygen Delivery Method: Circle system utilized Preoxygenation: Pre-oxygenation with 100% oxygen Induction Type: IV induction Ventilation: Mask ventilation without difficulty Laryngoscope Size: Mac and 4 Grade View: Grade I Tube type: Oral Tube size: 7.5 mm Number of attempts: 1 Airway Equipment and Method: Stylet Placement Confirmation: ETT inserted through vocal cords under direct vision,  positive ETCO2 and breath sounds checked- equal and bilateral Secured at: 21 cm Tube secured with: Tape Dental Injury: Teeth and Oropharynx as per pre-operative assessment

## 2018-04-19 NOTE — Progress Notes (Signed)
Patient alert and oriented, sitting in chair.  K pad ordered for shoulder discomfort.  Family at bedside.  Discussed post op day goals with patient including ambulation, IS, diet progression, pain, and nausea control.  Questions answered.

## 2018-04-19 NOTE — Discharge Instructions (Signed)
° ° ° °GASTRIC BYPASS/SLEEVE ° Home Care Instructions ° ° These instructions are to help you care for yourself when you go home. ° °Call: If you have any problems. °• Call [phone] and ask for the surgeon on call °• If you need immediate help, come to the ER at [facility].  °• Tell the ER staff that you are a new post-op gastric bypass or gastric sleeve patient °  °Signs and symptoms to report: • Severe vomiting or nausea °o If you cannot keep down clear liquids for longer than 1 day, call your surgeon  °• Abdominal pain that does not get better after taking your pain medication °• Fever over 100.4° F with chills °• Heart beating over 100 beats a minute °• Shortness of breath at rest °• Chest pain °•  Redness, swelling, drainage, or foul odor at incision (surgical) sites °•  If your incisions open or pull apart °• Swelling or pain in calf (lower leg) °• Diarrhea (Loose bowel movements that happen often), frequent watery, uncontrolled bowel movements °• Constipation, (no bowel movements for 3 days) if this happens: Pick one °o Milk of Magnesia, 2 tablespoons by mouth, 3 times a day for 2 days if needed °o Stop taking Milk of Magnesia once you have a bowel movement °o Call your doctor if constipation continues °Or °o Miralax  (instead of Milk of Magnesia) following the label instructions °o Stop taking Miralax once you have a bowel movement °o Call your doctor if constipation continues °• Anything you think is not normal °  °Normal side effects after surgery: • Unable to sleep at night or unable to focus °• Irritability or moody °• Being tearful (crying) or depressed °These are common complaints, possibly related to your anesthesia medications that put you to sleep, stress of surgery, and change in lifestyle.  This usually goes away a few weeks after surgery.  If these feelings continue, call your primary care doctor. °  °Wound Care: You may have surgical glue, steri-strips, or staples over your incisions after  surgery °• Surgical glue:  Looks like a clear film over your incisions and will wear off a little at a time °• Steri-strips: Strips of tape over your incisions. You may notice a yellowish color on the skin under the steri-strips. This is used to make the   steri-strips stick better. Do not pull the steri-strips off - let them fall off °• Staples: Staples may be removed before you leave the hospital °o If you go home with staples, call [facility], [phone] at for an appointment with your surgeon’s nurse to have staples removed 10 days after surgery. °• Showering: You may shower two (2) days after your surgery unless your surgeon tells you differently °o Wash gently around incisions with warm soapy water, rinse well, and gently pat dry  °o No tub baths until staples are removed, steri-strips fall off or glue is gone.  °  °Medications: • Medications should be liquid or crushed if larger than the size of a dime °• Extended release pills (medication that release a little bit at a time through the day) should NOT be crushed or cut. (examples include XL, ER, DR, SR) °• Depending on the size and number of medications you take, you may need to space (take a few throughout the day)/change the time you take your medications so that you do not over-fill your pouch (smaller stomach) °• Make sure you follow-up with your primary care doctor to   make medication changes needed during rapid weight loss and life-style changes °• If you have diabetes, follow up with the doctor that orders your diabetes medication(s) within one week after surgery and check your blood sugar regularly. °• Do not drive while taking prescription pain medication  °• It is ok to take Tylenol by the bottle instructions with your pain medicine or instead of your pain medicine as needed.  DO NOT TAKE NSAIDS (EXAMPLES OF NSAIDS:  IBUPROFREN/ NAPROXEN)  °Diet:                    First 2 Weeks ° You will see the dietician t about two (2) weeks  after your surgery. The dietician will increase the types of foods you can eat if you are handling liquids well: °• If you have severe vomiting or nausea and cannot keep down clear liquids lasting longer than 1 day, call your surgeon @ ([phone]) °Protein Shake °• Drink at least 2 ounces of shake 5-6 times per day °• Each serving of protein shakes (usually 8 - 12 ounces) should have: °o 15 grams of protein  °o And no more than 5 grams of carbohydrate  °• Goal for protein each day: °o Men = 80 grams per day °o Women = 60 grams per day °• Protein powder may be added to fluids such as non-fat milk or Lactaid milk or unsweetened Soy/Almond milk (limit to 35 grams added protein powder per serving) ° °Hydration °• Slowly increase the amount of water and other clear liquids as tolerated (See Acceptable Fluids) °• Slowly increase the amount of protein shake as tolerated  °•  Sip fluids slowly and throughout the day.  Do not use straws. °• May use sugar substitutes in small amounts (no more than 6 - 8 packets per day; i.e. Splenda) ° °Fluid Goal °• The first goal is to drink at least 8 ounces of protein shake/drink per day (or as directed by the nutritionist); some examples of protein shakes are Syntrax Nectar, Adkins Advantage, EAS Edge HP, and Unjury. See handout from pre-op Bariatric Education Class: °o Slowly increase the amount of protein shake you drink as tolerated °o You may find it easier to slowly sip shakes throughout the day °o It is important to get your proteins in first °• Your fluid goal is to drink 64 - 100 ounces of fluid daily °o It may take a few weeks to build up to this °• 32 oz (or more) should be clear liquids  °And  °• 32 oz (or more) should be full liquids (see below for examples) °• Liquids should not contain sugar, caffeine, or carbonation ° °Clear Liquids: °• Water or Sugar-free flavored water (i.e. Fruit H2O, Propel) °• Decaffeinated coffee or tea (sugar-free) °• Crystal Lite, Wyler’s Lite,  Minute Maid Lite °• Sugar-free Jell-O °• Bouillon or broth °• Sugar-free Popsicle:   *Less than 20 calories each; Limit 1 per day ° °Full Liquids: °Protein Shakes/Drinks + 2 choices per day of other full liquids °• Full liquids must be: °o No More Than 15 grams of Carbs per serving  °o No More Than 3 grams of Fat per serving °• Strained low-fat cream soup (except Cream of Potato or Tomato) °• Non-Fat milk °• Fat-free Lactaid Milk °• Unsweetened Soy Or Unsweetened Almond Milk °• Low Sugar yogurt (Dannon Lite & Fit, Greek yogurt; Oikos Triple Zero; Chobani Simply 100; Yoplait 100 calorie Greek - No Fruit on the Bottom) ° °  °Vitamins   and Minerals • Start 1 day after surgery unless otherwise directed by your surgeon °• 2 Chewable Bariatric Specific Multivitamin / Multimineral Supplement with iron (Example: Bariatric Advantage Multi EA) °• Chewable Calcium with Vitamin D-3 °(Example: 3 Chewable Calcium Plus 600 with Vitamin D-3) °o Take 500 mg three (3) times a day for a total of 1500 mg each day °o Do not take all 3 doses of calcium at one time as it may cause constipation, and you can only absorb 500 mg  at a time  °o Do not mix multivitamins containing iron with calcium supplements; take 2 hours apart °• Menstruating women and those with a history of anemia (a blood disease that causes weakness) may need extra iron °o Talk with your doctor to see if you need more iron °• Do not stop taking or change any vitamins or minerals until you talk to your dietitian or surgeon °• Your Dietitian and/or surgeon must approve all vitamin and mineral supplements °  °Activity and Exercise: Limit your physical activity as instructed by your doctor.  It is important to continue walking at home.  During this time, use these guidelines: °• Do not lift anything greater than ten (10) pounds for at least two (2) weeks °• Do not go back to work or drive until your surgeon says you can °• You may have sex when you feel comfortable  °o It is  VERY important for female patients to use a reliable birth control method; fertility often increases after surgery  °o All hormonal birth control will be ineffective for 30 days after surgery due to medications given during surgery a barrier method must be used. °o Do not get pregnant for at least 18 months °• Start exercising as soon as your doctor tells you that you can °o Make sure your doctor approves any physical activity °• Start with a simple walking program °• Walk 5-15 minutes each day, 7 days per week.  °• Slowly increase until you are walking 30-45 minutes per day °Consider joining our BELT program. [phone] or email belt@[employer] °  °Special Instructions Things to remember: °• Use your CPAP when sleeping if this applies to you ° °• [facility] has two free Bariatric Surgery Support Groups that meet monthly °o The 3rd Thursday of each month, 6 pm, [facility]  °o The 2nd Friday of each month, 11:45 am in the private dining room in the basement of [place] °• It is very important to keep all follow up appointments with your surgeon, dietitian, primary care physician, and behavioral health practitioner °• Routine follow up schedule with your surgeon include appointments at 2-3 weeks, 6-8 weeks, 6 months, and 1 year at a minimum.  Your surgeon may request to see you more often.   °o After the first year, please follow up with your bariatric surgeon and dietitian at least once a year in order to maintain best weight loss results °[facility]: [phone] °[facility]: [phone] °Bariatric Nurse Coordinator: [phone] °  °   Reviewed and Endorsed  °by [facility] Patient Education Committee, June, 2016 °Edits Approved: Aug, 2018 ° ° ° °

## 2018-04-19 NOTE — Transfer of Care (Signed)
Immediate Anesthesia Transfer of Care Note  Patient: Brianna French  Procedure(s) Performed: LAPAROSCOPIC GASTRIC SLEEVE RESECTION WITH HIATAL HERNIA REPAIR (Abdomen) UPPER GI ENDOSCOPY  Patient Location: PACU  Anesthesia Type:General  Level of Consciousness: awake, alert  and oriented  Airway & Oxygen Therapy: Patient Spontanous Breathing and Patient connected to face mask oxygen  Post-op Assessment: Report given to RN and Post -op Vital signs reviewed and stable  Post vital signs: Reviewed and stable  Last Vitals:  Vitals Value Taken Time  BP    Temp    Pulse    Resp    SpO2      Last Pain:  Vitals:   04/19/18 0848  TempSrc:   PainSc: 0-No pain         Complications: No apparent anesthesia complications

## 2018-04-19 NOTE — Progress Notes (Signed)
Patient  Reports "pain in my shoulder". Educated patient that this is most likely coming from gas and gave her a heat pack. Attempting to get a K pad, but is not available at present time. Tech will deliver one ASAP. Donne Hazel, RN

## 2018-04-20 ENCOUNTER — Encounter (HOSPITAL_COMMUNITY): Payer: Self-pay | Admitting: General Surgery

## 2018-04-20 LAB — CBC WITH DIFFERENTIAL/PLATELET
Abs Immature Granulocytes: 0.03 10*3/uL (ref 0.00–0.07)
Basophils Absolute: 0 10*3/uL (ref 0.0–0.1)
Basophils Relative: 0 %
Eosinophils Absolute: 0 10*3/uL (ref 0.0–0.5)
Eosinophils Relative: 0 %
HEMATOCRIT: 40.3 % (ref 36.0–46.0)
Hemoglobin: 12.7 g/dL (ref 12.0–15.0)
Immature Granulocytes: 0 %
Lymphocytes Relative: 21 %
Lymphs Abs: 1.5 10*3/uL (ref 0.7–4.0)
MCH: 32 pg (ref 26.0–34.0)
MCHC: 31.5 g/dL (ref 30.0–36.0)
MCV: 101.5 fL — ABNORMAL HIGH (ref 80.0–100.0)
MONO ABS: 1.2 10*3/uL — AB (ref 0.1–1.0)
MONOS PCT: 16 %
Neutro Abs: 4.6 10*3/uL (ref 1.7–7.7)
Neutrophils Relative %: 63 %
Platelets: 192 10*3/uL (ref 150–400)
RBC: 3.97 MIL/uL (ref 3.87–5.11)
RDW: 12.3 % (ref 11.5–15.5)
WBC: 7.3 10*3/uL (ref 4.0–10.5)
nRBC: 0 % (ref 0.0–0.2)

## 2018-04-20 LAB — COMPREHENSIVE METABOLIC PANEL
ALT: 31 U/L (ref 0–44)
AST: 29 U/L (ref 15–41)
Albumin: 3.4 g/dL — ABNORMAL LOW (ref 3.5–5.0)
Alkaline Phosphatase: 67 U/L (ref 38–126)
Anion gap: 7 (ref 5–15)
BUN: 9 mg/dL (ref 6–20)
CO2: 22 mmol/L (ref 22–32)
Calcium: 8.5 mg/dL — ABNORMAL LOW (ref 8.9–10.3)
Chloride: 107 mmol/L (ref 98–111)
Creatinine, Ser: 0.6 mg/dL (ref 0.44–1.00)
GFR calc Af Amer: >60 mL/min (ref >60)
Glucose, Bld: 139 mg/dL — ABNORMAL HIGH (ref 70–99)
Potassium: 3.9 mmol/L (ref 3.5–5.1)
Sodium: 136 mmol/L (ref 135–145)
TOTAL PROTEIN: 6.1 g/dL — AB (ref 6.5–8.1)
Total Bilirubin: 0.6 mg/dL (ref 0.3–1.2)

## 2018-04-20 MED ORDER — TRAMADOL HCL 50 MG PO TABS: 50.0000 mg | ORAL_TABLET | Freq: Four times a day (QID) | ORAL | 0 refills | Status: DC | PRN

## 2018-04-20 MED ORDER — ACETAMINOPHEN 500 MG PO TABS: 1000.0000 mg | ORAL_TABLET | Freq: Four times a day (QID) | ORAL | 0 refills | Status: AC

## 2018-04-20 MED ORDER — ONDANSETRON 4 MG PO TBDP: 4.0000 mg | ORAL_TABLET | Freq: Four times a day (QID) | ORAL | 0 refills | Status: DC | PRN

## 2018-04-20 NOTE — Plan of Care (Signed)

## 2018-04-20 NOTE — Progress Notes (Signed)
Patient alert and oriented siting in chair, Post op day 1.  Provided support and encouragement.  Encouraged pulmonary toilet, ambulation and small sips of liquids.  Started protein shakes this morning, on second cup.  Some mild cramping discussed temperature sensitivity recommended room temperature fluids.  All questions answered.  Will continue to monitor.

## 2018-04-20 NOTE — Progress Notes (Signed)

## 2018-04-20 NOTE — Discharge Summary (Signed)
Physician Discharge Summary  Brianna French UXL:[id number] DOB: 1960/09/01 DOA: 04/19/2018  PCP: Idelle Crouch, MD  Admit date: 04/19/2018 Discharge date: 04/20/2018  Recommendations for Outpatient Follow-up:   Follow-up Information    Greer Pickerel, MD. Go on 05/05/2018.   Specialty:  General Surgery Why:  at 1015.  Please arrive 15 minutes early for appointment.  Thank you. Contact information: [address] [place] [address] [phone]        Carlena Hurl, PA-C. Go on 06/01/2018.   Specialty:  General Surgery Why:  at 415.  Please arrive 15 minutes early for appointment.  Thank you Contact information: [place] [address] [phone]          Discharge Diagnoses:  Principal Problem:   Obesity (BMI 30-39.9) Active Problems:   Dyslipidemia   Obstructive sleep apnea   Essential hypertension   Osteoarthritis of multiple joints   Hiatal hernia   Severe obesity ([place])   Surgical Procedure: Laparoscopic Sleeve Gastrectomy with hiatal hernia repair, upper endoscopy  Discharge Condition: Good Disposition: Home  Diet recommendation: Postoperative sleeve gastrectomy diet (liquids only)  There were no vitals filed for this visit.   Hospital Course:  The patient was admitted for a planned laparoscopic sleeve gastrectomy. Please see operative note. Preoperatively the patient was given 5000 units of subcutaneous heparin for DVT prophylaxis. Postoperative prophylactic Lovenox dosing was started on the evening of postoperative day 0. ERAS protocol was used. On the evening of postoperative day 0, the patient was started on water and ice chips. On postoperative day 1 the patient had no fever or tachycardia and was tolerating water in their diet was gradually advanced throughout the day. The patient was ambulating without difficulty. Their vital signs are stable without fever or tachycardia. Their hemoglobin had remained stable. The  patient was maintained on their home settings for CPAP therapy. The patient had received discharge instructions and counseling. They were deemed stable for discharge and had met discharge criteria  BP 127/82 (BP Location: Left Arm)   Pulse (!) 49   Temp 98.2 F (36.8 C) (Oral)   Resp 20   SpO2 100%   Gen: alert, NAD, non-toxic appearing Pupils: equal, no scleral icterus Pulm: Lungs clear to auscultation, symmetric chest rise CV: regular rate and rhythm Abd: soft, min tender, nondistended.  No cellulitis. No incisional hernia Ext: no edema, no calf tenderness Skin: no rash, no jaundice   Discharge Instructions  Discharge Instructions    Ambulate hourly while awake   Complete by:  As directed    Call MD for:  difficulty breathing, headache or visual disturbances   Complete by:  As directed    Call MD for:  persistant dizziness or light-headedness   Complete by:  As directed    Call MD for:  persistant nausea and vomiting   Complete by:  As directed    Call MD for:  redness, tenderness, or signs of infection (pain, swelling, redness, odor or green/yellow discharge around incision site)   Complete by:  As directed    Call MD for:  severe uncontrolled pain   Complete by:  As directed    Call MD for:  temperature >101 F   Complete by:  As directed    Diet bariatric full liquid   Complete by:  As directed    Discharge instructions   Complete by:  As directed    See bariatric discharge instructions   Incentive spirometry  Complete by:  As directed    Perform hourly while awake     Allergies as of 04/20/2018      Reactions   Ivp Dye [iodinated Diagnostic Agents] Other (See Comments)   ALSO VICRYL CAUSES OPEN SORES/BLISTERS   Other Other (See Comments)   Vicryl suture causes sores/blisters, narcotics make her sick   Pneumococcal Vac Polyvalent Other (See Comments)   Fever, edema   Pneumococcal Vaccine Other (See Comments)   Fever, edema      Medication List    STOP  taking these medications   aspirin EC 81 MG tablet   ibuprofen 200 MG tablet Commonly known as:  ADVIL,MOTRIN     TAKE these medications   acetaminophen 500 MG tablet Commonly known as:  TYLENOL Take 2 tablets (1,000 mg total) by mouth every 6 (six) hours for 4 days.   B-12 1000 MCG Tabs Take 1,000 mcg by mouth daily.   CELEBRATE CALCIUM PLUS 500 PO Take 500 mg by mouth 3 (three) times daily.   CELEBRATE MULTI-COMPLETE 60 PO Take 1 tablet by mouth 2 (two) times daily.   clotrimazole-betamethasone cream Commonly known as:  LOTRISONE Apply 1 application topically daily as needed (rash). APPLY A SMALL AMOUNT TO AFFECTED AREA 2 TIMES A DAY AS DIRECTED   CRESTOR 10 MG tablet Generic drug:  rosuvastatin Take 10 mg by mouth every morning. TAKE ONE TABLET BY MOUTH IN THE MORNING   flecainide 100 MG tablet Commonly known as:  TAMBOCOR TAKE 1 TABLET BY MOUTH 2 TIMES DAILY.   MAG-OXIDE PO Take 800 mg by mouth every morning.   ondansetron 4 MG disintegrating tablet Commonly known as:  ZOFRAN-ODT Take 1 tablet (4 mg total) by mouth every 6 (six) hours as needed for nausea or vomiting.   ranitidine 150 MG tablet Commonly known as:  ZANTAC Take 150 mg by mouth daily as needed for heartburn.   traMADol 50 MG tablet Commonly known as:  ULTRAM Take 1 tablet (50 mg total) by mouth every 6 (six) hours as needed for severe pain.   venlafaxine XR 150 MG 24 hr capsule Commonly known as:  EFFEXOR-XR Take 150 mg by mouth daily with breakfast.   Vitamin D3 50 MCG (2000 UT) Tabs Take 2,000 Units by mouth daily.      Follow-up Information    Greer Pickerel, MD. Go on 05/05/2018.   Specialty:  General Surgery Why:  at 1015.  Please arrive 15 minutes early for appointment.  Thank you. Contact information: [address] [place] [address] [phone]        Carlena Hurl, PA-C. Go on 06/01/2018.   Specialty:  General Surgery Why:  at 415.  Please arrive 15 minutes early  for appointment.  Thank you Contact information: [address] [place] [address] [phone]            The results of significant diagnostics from this hospitalization (including imaging, microbiology, ancillary and laboratory) are listed below for reference.    Significant Diagnostic Studies: No results found.  Labs: Basic Metabolic Panel: Recent Labs  Lab 04/20/18 0440  NA 136  K 3.9  CL 107  CO2 22  GLUCOSE 139*  BUN 9  CREATININE 0.60  CALCIUM 8.5*   Liver Function Tests: Recent Labs  Lab 04/20/18 0440  AST 29  ALT 31  ALKPHOS 67  BILITOT 0.6  PROT 6.1*  ALBUMIN 3.4*    CBC: Recent Labs  Lab 04/19/18  1157 04/20/18 0440  WBC  --  7.3  NEUTROABS  --  4.6  HGB 13.9 12.7  HCT 44.5 40.3  MCV  --  101.5*  PLT  --  192    CBG: No results for input(s): GLUCAP in the last 168 hours.  Principal Problem:   Obesity (BMI 30-39.9) Active Problems:   Dyslipidemia   Obstructive sleep apnea   Essential hypertension   Osteoarthritis of multiple joints   Hiatal hernia   Severe obesity ([place])   Time coordinating discharge: 15 min  Signed:  Gayland Curry, MD [facility], [place] [phone] 04/20/2018, 12:36 PM

## 2018-04-23 ENCOUNTER — Other Ambulatory Visit: Payer: Self-pay | Admitting: *Deleted

## 2018-04-23 NOTE — Patient Outreach (Signed)
[facility]) Care Management  04/23/2018  Brianna French August 13, 1960 [id number]  Transition of care telephone call  Referral received: 03/19/18 Initial outreach: 04/15/18- pre-op call; left message Surgery date: 04/19/18 Insurance: [facility] Focus/Choice/Save Plan   Subjective: Initial unsuccessful telephone call to patient's mobile/home number in order to complete transition of care assessment; no answer, left HIPAA compliant voicemail message requesting return call.   Objective: Per the electronic medical record, Brianna French had laparoscopic gastric sleeve surgery on 04/19/18 at [facility].  She completed her pre-op admission testing visit on 04/07/18 .  Comorbidities include: Atrial fibrillation, Obstructive sleep apnea, Adrenal mass, and dyslipidemia She was discharged to home on 04/20/18 without the need for home health services or durable medical equipment per the discharge summary.    Plan: This RNCM will route unsuccessful outreach letter with [place] Management pamphlet and 24 hour Nurse Advice Line Magnet to [place] Management clinical pool to be mailed to patient's home address. This RNCM will attempt another outreach within 4 business days.  Barrington Ellison RN,CCM,CDE [place] Management Coordinator Office Phone [phone] Office Fax [phone]

## 2018-04-26 ENCOUNTER — Telehealth (HOSPITAL_COMMUNITY): Payer: Self-pay

## 2018-04-26 NOTE — Telephone Encounter (Addendum)
Patient called to discuss post bariatric surgery follow up questions.  See below:   1.  Tell me about your pain and pain management?patient having to take medications for neck pain not abdominal pain  2.  Let's talk about fluid intake.  How much total fluid are you taking in?50-60 ounces  3.  How much protein have you taken in the last 2 days? 60 grams 4.  Have you had nausea?  Tell me about when have experienced nausea and what you did to help? No nausea 5.  Has the frequency or color changed with your urine?urine clear and light in color  6.  Tell me what your incisions look like?look ok  7.  Have you been passing gas? BM?had bm  8.  If a problem or question were to arise who would you call?  Do you know contact numbers for [facility], [facility], and [facility]?aware of how to contact all services  9.  How has the walking going?walking a lot until neck pain  10.  How are your vitamins and calcium going?  How are you taking them?taking mvi and calcium

## 2018-04-28 ENCOUNTER — Ambulatory Visit: Payer: Self-pay | Admitting: *Deleted

## 2018-04-28 ENCOUNTER — Other Ambulatory Visit: Payer: Self-pay | Admitting: *Deleted

## 2018-04-28 NOTE — Patient Outreach (Signed)
[facility]) Care Management  04/28/2018  Brianna French 1960-09-08 [id number]   Transition of care call   Referral received: 03/19/18 Initial outreach: 04/15/18 Insurance: [place] Choice Plan   Subjective: Successful telephone call to patient's preferred number in order to complete transition of care assessment; 2 HIPAA identifiers verified. Explained purpose of call and completed transition of care assessment.  Brianna French states she is doing great from the aspect of her bariatric surgery but she says she has what feels like a pinched nerve in her neck that is preventing her from returning to work. She says she is limited as to what she can take for pain so she is using ice, Icy Hot and tylenol to treat the neck pain. She says she has notified both her surgeon and her primary care provider and the bariatric nurse coordinator about the neck pain but was not given any other advice other than what she is currently doing to treat the pain. She says she doesn't like taking the prescription pain medication because it sedates her.     Objective:  Brianna French laparoscopic gastric sleeve surgeryon 04/19/18 at [facility].  She completed her pre-op admission testing visit on2/5/20. Comorbidities include: Atrial fibrillation, Obstructive sleep apnea, Adrenal mass, and dyslipidemia She was discharged to home on 04/20/18 without the need for home health services or durable medical equipment per the discharge summary.    Assessment:  Patient voices good understanding of all discharge instructions.  See transition of care flowsheet for assessment details.   Plan:  Suggested she consider an over the counter TENS (transcutaneous electrical nerve stimulation) and/or EMS (electrical muscle stimulation) to treat her neck pain  Reviewed [place] Active Health Management 2020 Wellness Requirements of: Completing the computerized Health Assessment and the Health Action  Step with Active Health Management [facility]) by November 02 2018 AND have an annual physical between March 03, 2017 and September 01, 2018.  No ongoing care management needs identified so will close case to [facility] Management care management services.  Barrington Ellison RN,CCM,CDE [place] Management Coordinator Office Phone [phone] Office Fax [phone]

## 2018-04-30 ENCOUNTER — Encounter: Payer: 59 | Attending: General Surgery | Admitting: Dietician

## 2018-04-30 ENCOUNTER — Encounter: Payer: Self-pay | Admitting: Dietician

## 2018-04-30 VITALS — Ht 64.0 in | Wt 215.2 lb

## 2018-04-30 DIAGNOSIS — Z6839 Body mass index (BMI) 39.0-39.9, adult: Secondary | ICD-10-CM

## 2018-04-30 DIAGNOSIS — Z6841 Body Mass Index (BMI) 40.0 and over, adult: Secondary | ICD-10-CM | POA: Insufficient documentation

## 2018-04-30 DIAGNOSIS — Z713 Dietary counseling and surveillance: Secondary | ICD-10-CM | POA: Insufficient documentation

## 2018-04-30 DIAGNOSIS — E6609 Other obesity due to excess calories: Secondary | ICD-10-CM

## 2018-04-30 NOTE — Patient Instructions (Signed)
   Start adding some solid protein foods in 1oz or 1/4 cup portions; follow guidelines for chewing, avoiding fluids while eating. Allow up to 30 minutes to eat, then wait 3-4 hours to eat again.   Gradually decrease protein shakes as intake of solid protein increases, maintaining 60g + goal daily.  Continue with regular physcial activity, great job!

## 2018-04-30 NOTE — Progress Notes (Signed)
Follow-up visit:  2 Weeks Post-Operative sleeve gastrectomy Surgery  Medical Nutrition Therapy:  Appt start time: 1430 end time:  1500.  Primary concerns today: Post-operative Bariatric Surgery Nutrition Management.  Preferred Learning Style:   Auditory  Visual  Hands on    Learning Readiness:   Change in progress  Weight: 215.2lbs Height: 5'4" Weight at previous NDES visit: 232.5lbs Weight loss of 17.3lbs since previous visit on 03/19/18    Progress:  Patient denies and surgery-related problems, no pain or GI side effects.  She experienced pinched nerve in neck about 1 week ago, with significant pain for several days, now resolved but has some residual tenderness.  Patient voices readiness to advance diet.   Dietary recall: Breakfast: protein shake 30g Snack: water, flavored water  Lunch: yogurt if hungry Snack: fluids  Dinner: protein shake 30g Snack: sometimes yogurt, fluids  Fluid intake: 64oz+ Estimated total protein intake: 60-70g  Medications: flecainide, magnesium oxide, rosuvastatin, venlafaxine Supplementation: Celebrate multi-complete 2x daily; calcium citrate 3x daily  Using straws: no Drinking while eating: no Hair loss: no Carbonated beverages: no N/V/D/C: no Dumping syndrome: no Patient reports odorous breath, dry mouth  Recent physical activity:  Walking 30 minutes, resuming ADLs preparing to paint home  Progress Towards Goal(s):  In progress.  Handouts given during visit include:  Phase 3 and 4 bariatric diet handouts  Goals and instructions   Nutritional Diagnosis:  [place]-3.3 Overweight/obesity As related to history of excess calories and inactivity.  As evidenced by patient with current BMI of 39.9, following dietary guidelines for ongoing weight loss after bariatric surgery.    Intervention:    Discussed patient's progress since surgery.  She is following diet guidelines closely and is progressing well with physical  activity.  Reviewed guidelines for adding protein foods into her diet, importance of monitoring intake to ensure adequate protein and fluids, appropriate starting portions, soft, moist protein choices.  Discussed adding low-carb vegetables when able to consume enough solid protein to meet daily needs.   Teaching Method Utilized:  Visual Auditory Hands on  Barriers to learning/adherence to lifestyle change: none  Demonstrated degree of understanding via:  Teach Back   Monitoring/Evaluation:  Dietary intake, exercise, and body weight. Follow up in 6 weeks for 2 month post-op visit.

## 2018-05-12 ENCOUNTER — Other Ambulatory Visit: Payer: Self-pay | Admitting: Internal Medicine

## 2018-05-12 DIAGNOSIS — Z1231 Encounter for screening mammogram for malignant neoplasm of breast: Secondary | ICD-10-CM

## 2018-05-26 ENCOUNTER — Other Ambulatory Visit: Payer: Self-pay

## 2018-05-26 ENCOUNTER — Ambulatory Visit
Admission: RE | Admit: 2018-05-26 | Discharge: 2018-05-26 | Disposition: A | Discharge status: Home / Self Care | Payer: 59 | Source: Ambulatory Visit | Attending: Internal Medicine | Admitting: Internal Medicine

## 2018-05-26 DIAGNOSIS — Z1231 Encounter for screening mammogram for malignant neoplasm of breast: Secondary | ICD-10-CM | POA: Insufficient documentation

## 2018-06-01 DIAGNOSIS — Z9884 Bariatric surgery status: Secondary | ICD-10-CM | POA: Diagnosis not present

## 2018-06-01 DIAGNOSIS — I471 Supraventricular tachycardia: Secondary | ICD-10-CM | POA: Diagnosis not present

## 2018-06-01 DIAGNOSIS — R7309 Other abnormal glucose: Secondary | ICD-10-CM | POA: Diagnosis not present

## 2018-06-01 DIAGNOSIS — E782 Mixed hyperlipidemia: Secondary | ICD-10-CM | POA: Diagnosis not present

## 2018-06-09 ENCOUNTER — Encounter: Payer: 59 | Attending: General Surgery | Admitting: Dietician

## 2018-06-09 ENCOUNTER — Other Ambulatory Visit: Payer: Self-pay

## 2018-06-09 ENCOUNTER — Encounter: Payer: Self-pay | Admitting: Dietician

## 2018-06-09 VITALS — Ht 64.0 in | Wt 197.1 lb

## 2018-06-09 DIAGNOSIS — Z713 Dietary counseling and surveillance: Secondary | ICD-10-CM | POA: Insufficient documentation

## 2018-06-09 DIAGNOSIS — Z6833 Body mass index (BMI) 33.0-33.9, adult: Secondary | ICD-10-CM

## 2018-06-09 DIAGNOSIS — Z6841 Body Mass Index (BMI) 40.0 and over, adult: Secondary | ICD-10-CM | POA: Insufficient documentation

## 2018-06-09 DIAGNOSIS — E6609 Other obesity due to excess calories: Secondary | ICD-10-CM

## 2018-06-09 NOTE — Progress Notes (Signed)
Follow-up visit:  2 Months Post-Operative Sleeve Gastrectomy Surgery  Medical Nutrition Therapy:  Appt start time: 0160 end time:  1215.  Primary concerns today: Post-operative Bariatric Surgery Nutrition Management.  Preferred Learning Style:   Auditory  Visual  Hands on    Learning Readiness:   Change in progress  Weight: 197.1lbs Height: 5'4" BMI 33.8 Weight at previous NDES visit: 215.2lbs    Progress:   Weight loss continues, has lost total of 41lbs since her initial NDES visit 12/30/17. She states weight has been stable for at least one week.  Patient reports definite change in taste, especially sensitive to sweet; has craved vegetables ie steamed broccoli.   She reports  intolerance to some foods ie eggs; typically feels nauseated after eating those foods.   She also feels nauseated when hungry, reports sudden onset. So she eats quickly to relieve nausea but then sometimes has discomfort due to quick eating. She has started allowing 30 minutes or more to finish meals.   Taste of chewable multivitamin is becoming less tolerable. Plans to try alternate brand of bariatric multivitamin.   Dietary recall: Breakfast: protein shake -- 30g protein (elects to have shake due to early work hours and intolerance to eggs) Snack: cheese stick or vanilla Greek yogurt  Lunch: tuna with salsa or chicken or other protein; has tried a few corn chips and tolerated well (best available food when she had strong hunger symptoms with nausea; felt better after eating) Snack: another cheese stick if hungry  Dinner: 2-3oz meat ie shrimp + broccoli/ cauliflower Snack: none  Fluid intake: 64oz daily (average) 10-20oz coffee, 11oz protein shake, 48oz+/- water (flavored) Estimated total protein intake: 70-75g daily  Medications: low-dose aspirin, flecainide, magnesium oxide, miralax prn, rosuvastatin, venlafaxine Supplementation: Celebrate multivitamins 2x daily + calcium citrate 3x  daily  Using straws: no Drinking while eating: no Hair loss: no Carbonated beverages: no N/V/D/C: some nausea when eating too quickly or feeling too hungry Dumping syndrome: no  Recent physical activity:  Walking 2+ miles 6 times a week. Plans to add strengthening exercise for upper body.   Progress Towards Goal(s):  In progress.  Handouts given during visit include:  Phase 5 and 6 bariatric diet handouts   Nutritional Diagnosis:  [place]-3.3 Overweight/obesity As related to history of excess calories and inactivity.  As evidenced by patient with current BMI of 33.8, following bariatric diet guidelines for continued weight loss after sleeve gastrectomy.    Intervention:    Reviewed progress since previous visit.  Reviewed goals for protein and fluids and appropriate food portions.   Instructed on diet advancement -- to increase variety of low-carb vegetables with protein sources, then gradually add some starch vegetables (ideally peas, sweet potato, and/or baby red-skinned potatoes) and fruits.   Advised avoidance of breads, rice, pastas, and other "sticky" starchy foods, as well as hard foods such as nuts and seeds.   Teaching Method Utilized:  Visual Auditory   Barriers to learning/adherence to lifestyle change: none  Demonstrated degree of understanding via:  Teach Back   Monitoring/Evaluation:  Dietary intake, exercise, and body weight. Follow up in 4 months for 12-month post-op visit.

## 2018-06-09 NOTE — Patient Instructions (Signed)
   Continue to add low-carb vegetables along with lean proteins.   Keep up the regular exercise, great job!  Gradually add small portions of fruits if needed to prevent low blood sugar and prevent nausea.

## 2018-09-01 DIAGNOSIS — F331 Major depressive disorder, recurrent, moderate: Secondary | ICD-10-CM | POA: Diagnosis not present

## 2018-09-08 DIAGNOSIS — K219 Gastro-esophageal reflux disease without esophagitis: Secondary | ICD-10-CM | POA: Diagnosis not present

## 2018-09-08 DIAGNOSIS — Z9884 Bariatric surgery status: Secondary | ICD-10-CM | POA: Diagnosis not present

## 2018-09-08 DIAGNOSIS — E663 Overweight: Secondary | ICD-10-CM | POA: Diagnosis not present

## 2018-09-08 DIAGNOSIS — Z8679 Personal history of other diseases of the circulatory system: Secondary | ICD-10-CM | POA: Diagnosis not present

## 2018-09-08 DIAGNOSIS — G4730 Sleep apnea, unspecified: Secondary | ICD-10-CM | POA: Diagnosis not present

## 2018-09-08 DIAGNOSIS — R7303 Prediabetes: Secondary | ICD-10-CM | POA: Diagnosis not present

## 2018-10-25 ENCOUNTER — Other Ambulatory Visit: Payer: Self-pay

## 2018-10-25 DIAGNOSIS — R6889 Other general symptoms and signs: Secondary | ICD-10-CM | POA: Diagnosis not present

## 2018-10-25 DIAGNOSIS — Z20822 Contact with and (suspected) exposure to covid-19: Secondary | ICD-10-CM

## 2018-10-26 LAB — NOVEL CORONAVIRUS, NAA: SARS-CoV-2, NAA: NOT DETECTED

## 2018-11-17 ENCOUNTER — Other Ambulatory Visit
Admission: RE | Admit: 2018-11-17 | Discharge: 2018-11-17 | Disposition: A | Discharge status: Home / Self Care | Payer: 59 | Attending: Internal Medicine | Admitting: Internal Medicine

## 2018-11-17 DIAGNOSIS — Z9884 Bariatric surgery status: Secondary | ICD-10-CM | POA: Diagnosis not present

## 2018-11-17 DIAGNOSIS — E782 Mixed hyperlipidemia: Secondary | ICD-10-CM | POA: Diagnosis not present

## 2018-11-17 DIAGNOSIS — Z79899 Other long term (current) drug therapy: Secondary | ICD-10-CM | POA: Insufficient documentation

## 2018-11-17 DIAGNOSIS — R7309 Other abnormal glucose: Secondary | ICD-10-CM | POA: Diagnosis not present

## 2018-11-17 LAB — URINALYSIS, ROUTINE W REFLEX MICROSCOPIC
Bilirubin Urine: NEGATIVE
Glucose, UA: NEGATIVE mg/dL
Hgb urine dipstick: NEGATIVE
Ketones, ur: NEGATIVE mg/dL
Leukocytes,Ua: NEGATIVE
Nitrite: NEGATIVE
Protein, ur: NEGATIVE mg/dL
Specific Gravity, Urine: 1.016 (ref 1.005–1.030)
pH: 8 (ref 5.0–8.0)

## 2018-11-17 LAB — COMPREHENSIVE METABOLIC PANEL
ALT: 24 U/L (ref 0–44)
AST: 25 U/L (ref 15–41)
Albumin: 3.9 g/dL (ref 3.5–5.0)
Alkaline Phosphatase: 75 U/L (ref 38–126)
Anion gap: 7 (ref 5–15)
BUN: 19 mg/dL (ref 6–20)
CO2: 30 mmol/L (ref 22–32)
Calcium: 9.3 mg/dL (ref 8.9–10.3)
Chloride: 105 mmol/L (ref 98–111)
Creatinine, Ser: 0.75 mg/dL (ref 0.44–1.00)
GFR calc Af Amer: >60 mL/min (ref >60)
GFR calc non Af Amer: >60 mL/min (ref >60)
Glucose, Bld: 98 mg/dL (ref 70–99)
Potassium: 3.8 mmol/L (ref 3.5–5.1)
Sodium: 142 mmol/L (ref 135–145)
Total Bilirubin: 0.7 mg/dL (ref 0.3–1.2)
Total Protein: 6.6 g/dL (ref 6.5–8.1)

## 2018-11-17 LAB — CBC WITH DIFFERENTIAL/PLATELET
Abs Immature Granulocytes: 0.02 10*3/uL (ref 0.00–0.07)
Basophils Absolute: 0 10*3/uL (ref 0.0–0.1)
Basophils Relative: 1 %
Eosinophils Absolute: 0.1 10*3/uL (ref 0.0–0.5)
Eosinophils Relative: 2 %
HCT: 40.4 % (ref 36.0–46.0)
Hemoglobin: 13.1 g/dL (ref 12.0–15.0)
Immature Granulocytes: 0 %
Lymphocytes Relative: 23 %
Lymphs Abs: 1.1 10*3/uL (ref 0.7–4.0)
MCH: 31.4 pg (ref 26.0–34.0)
MCHC: 32.4 g/dL (ref 30.0–36.0)
MCV: 96.9 fL (ref 80.0–100.0)
Monocytes Absolute: 0.7 10*3/uL (ref 0.1–1.0)
Monocytes Relative: 15 %
Neutro Abs: 2.8 10*3/uL (ref 1.7–7.7)
Neutrophils Relative %: 59 %
Platelets: 231 10*3/uL (ref 150–400)
RBC: 4.17 MIL/uL (ref 3.87–5.11)
RDW: 12.3 % (ref 11.5–15.5)
WBC: 4.8 10*3/uL (ref 4.0–10.5)
nRBC: 0 % (ref 0.0–0.2)

## 2018-11-17 LAB — VITAMIN B12: Vitamin B-12: 787 pg/mL (ref 180–914)

## 2018-11-17 LAB — LIPID PANEL
Cholesterol: 178 mg/dL (ref 0–200)
HDL: 71 mg/dL (ref >40)
LDL Cholesterol: 93 mg/dL (ref 0–99)
Total CHOL/HDL Ratio: 2.5 RATIO
Triglycerides: 69 mg/dL (ref <150)
VLDL: 14 mg/dL (ref 0–40)

## 2018-11-17 LAB — TSH: TSH: 1.597 u[IU]/mL (ref 0.350–4.500)

## 2018-11-17 LAB — IRON AND TIBC
Iron: 99 ug/dL (ref 28–170)
Saturation Ratios: 38 % — ABNORMAL HIGH (ref 10.4–31.8)
TIBC: 264 ug/dL (ref 250–450)
UIBC: 165 ug/dL

## 2018-11-18 LAB — HEMOGLOBIN A1C
Hgb A1c MFr Bld: 5.4 % (ref 4.8–5.6)
Mean Plasma Glucose: 108 mg/dL

## 2018-12-01 DIAGNOSIS — Z9884 Bariatric surgery status: Secondary | ICD-10-CM | POA: Diagnosis not present

## 2018-12-01 DIAGNOSIS — I480 Paroxysmal atrial fibrillation: Secondary | ICD-10-CM | POA: Diagnosis not present

## 2018-12-01 DIAGNOSIS — M50322 Other cervical disc degeneration at C5-C6 level: Secondary | ICD-10-CM | POA: Diagnosis not present

## 2018-12-01 DIAGNOSIS — Z0000 Encounter for general adult medical examination without abnormal findings: Secondary | ICD-10-CM | POA: Diagnosis not present

## 2018-12-01 DIAGNOSIS — E782 Mixed hyperlipidemia: Secondary | ICD-10-CM | POA: Diagnosis not present

## 2018-12-01 DIAGNOSIS — M5412 Radiculopathy, cervical region: Secondary | ICD-10-CM | POA: Diagnosis not present

## 2018-12-08 DIAGNOSIS — M8588 Other specified disorders of bone density and structure, other site: Secondary | ICD-10-CM | POA: Diagnosis not present

## 2018-12-21 ENCOUNTER — Encounter: Payer: Self-pay | Admitting: *Deleted

## 2018-12-27 ENCOUNTER — Telehealth: Payer: Self-pay

## 2018-12-27 NOTE — Telephone Encounter (Signed)
Returned patients call regarding scheduling her colonoscopy for January 2021.  LVM asking her to call the office in December or January to schedule as we are not scheduling for next year yet.  Thanks [employer]

## 2019-01-17 ENCOUNTER — Telehealth: Payer: Self-pay | Admitting: Gastroenterology

## 2019-01-17 NOTE — Telephone Encounter (Signed)
Returned patients call.  Left message with her employer to have her call me back at her convenience.  Thanks [employer]

## 2019-01-17 NOTE — Telephone Encounter (Signed)
Pt left vm to schedule a colonoscopy hopefully before the end of this year please call pt

## 2019-01-18 ENCOUNTER — Telehealth: Payer: Self-pay

## 2019-01-18 ENCOUNTER — Other Ambulatory Visit: Payer: Self-pay

## 2019-01-18 DIAGNOSIS — Z1211 Encounter for screening for malignant neoplasm of colon: Secondary | ICD-10-CM

## 2019-01-18 NOTE — Telephone Encounter (Signed)
Gastroenterology Pre-Procedure Review  Request Date: Wed 02/09/19 Requesting Physician: Dr. Vicente Males  PATIENT REVIEW QUESTIONS: The patient responded to the following health history questions as indicated:    1. Are you having any GI issues? yes (constipation) 2. Do you have a personal history of Polyps? no 3. Do you have a family history of Colon Cancer or Polyps? no 4. Diabetes Mellitus? no 5. Joint replacements in the past 12 months?gastric sleeve Feb 2020 6. Major health problems in the past 3 months?no 7. Any artificial heart valves, MVP, or defibrillator?Rapid Atrial Fib Dr. Crissie Sickles    MEDICATIONS & ALLERGIES:    Patient reports the following regarding taking any anticoagulation/antiplatelet therapy:   Plavix, Coumadin, Eliquis, Xarelto, Lovenox, Pradaxa, Brilinta, or Effient? no Aspirin? yes (81 mg daily)  Patient confirms/reports the following medications:  Current Outpatient Medications  Medication Sig Dispense Refill  . aspirin EC 81 MG tablet Take 81 mg by mouth daily.    . Calcium Citrate-Vitamin D (CELEBRATE CALCIUM PLUS 500 PO) Take 500 mg by mouth 3 (three) times daily.    . clotrimazole-betamethasone (LOTRISONE) cream Apply 1 application topically daily as needed (rash). APPLY A SMALL AMOUNT TO AFFECTED AREA 2 TIMES A DAY AS DIRECTED    . flecainide (TAMBOCOR) 100 MG tablet TAKE 1 TABLET BY MOUTH 2 TIMES DAILY. (Patient taking differently: Take 100 mg by mouth 2 (two) times daily. ) 180 tablet 0  . Magnesium Oxide (MAG-OXIDE PO) Take 800 mg by mouth every morning.     . Multiple Vitamins-Minerals (CELEBRATE MULTI-COMPLETE 60 PO) Take 1 tablet by mouth 2 (two) times daily.    . polyethylene glycol (MIRALAX / GLYCOLAX) 17 g packet Take 17 g by mouth daily.    . rosuvastatin (CRESTOR) 10 MG tablet Take 10 mg by mouth every morning. TAKE ONE TABLET BY MOUTH IN THE MORNING    . venlafaxine XR (EFFEXOR-XR) 150 MG 24 hr capsule Take 150 mg by mouth daily with breakfast.   3    No current facility-administered medications for this visit.     Patient confirms/reports the following allergies:  Allergies  Allergen Reactions  . Ivp Dye [Iodinated Diagnostic Agents] Other (See Comments)    ALSO VICRYL CAUSES OPEN SORES/BLISTERS  . Other Other (See Comments)    Vicryl suture causes sores/blisters, narcotics make her sick  . Pneumococcal Vac Polyvalent Other (See Comments)    Fever, edema  . Pneumococcal Vaccine Other (See Comments)    Fever, edema    No orders of the defined types were placed in this encounter.   AUTHORIZATION INFORMATION Primary Insurance: 1D#: Group #:  Secondary Insurance: 1D#: Group #:  SCHEDULE INFORMATION: Date: Wed 02/09/19 Time: Location:[facility]

## 2019-01-18 NOTE — Telephone Encounter (Signed)
Returned patients call to schedule her for her colonoscopy.  LVM for her to call me back to schedule.  Thanks [employer]

## 2019-01-19 ENCOUNTER — Telehealth: Payer: Self-pay | Admitting: Gastroenterology

## 2019-01-19 ENCOUNTER — Telehealth: Payer: Self-pay | Admitting: *Deleted

## 2019-01-19 NOTE — Telephone Encounter (Signed)
   [facility] Pre-operative Risk Assessment    Request for surgical clearance:  1. What type of surgery is being performed? COLONOSCOPY   2. When is this surgery scheduled?  02/09/19   3. What type of clearance is required (medical clearance vs. Pharmacy clearance to hold med vs. Both)? MEDICAL  4. Are there any medications that need to be held prior to surgery and how long? ASA    5. Practice name and name of physician performing surgery? [facility];  I LEFT MESSAGE TO CALL BACK WITH NAME OF DOCTOR PREFORMING PROCEDURE  6. What is your office phone number [phone]    7.   What is your office fax number [phone]  8.   Anesthesia type (None, local, MAC, general) ? GENERAL   Julaine Hua 01/19/2019, 11:33 AM  _________________________________________________________________   (provider comments below)

## 2019-01-19 NOTE — Telephone Encounter (Signed)
[phone] is Brianna French's Direct number. The doctor doing the procedure is Dr. Jonathon Bellows

## 2019-01-19 NOTE — Telephone Encounter (Signed)
Arbie Cookey from [facility] left vm to obtain the name of  Doctor who needed the clearance for pts procedure please call [phone]

## 2019-01-19 NOTE — Telephone Encounter (Signed)
      Primary Cardiologist: Cristopher Peru, MD  Chart reviewed as part of pre-operative protocol coverage. Patient has history of paroxysmal atrial fibrillation on Flecainide, dyslipidemia, OSA on CPAP, and obesity. CHA2DS-VASC =1 (female) so patient is only on ASA 81mg  daily. Last seen by Pecolia Ades on 12/14/2017 for pre-op eval for bariatric surgery. At that visit, she reported mild DOE when walking up stairs which she attributed to her weight as well as brief episodes of tachycardia but nothing over 5 minutes. She was felt to be at acceptable risk for surgery at that time. She has not been seen since that time. She has an appointment with Dr. Lovena Le scheduled for 02/16/2019. Tried to call patient today but she did not answer. Left a message asking patient to call back and ask to speak with the pre-op team. Although it has been a little over 1 year since she was last seen, colonoscopy is a low risk procedure so she may be able to be cleared over the phone if she is doing well.  Will route message to Dr. Lovena Le for his input on holding ASA given patient is not on any other anticoagulation. Dr. Lovena Le, please route message back to pre-op pool.   Thank you!   Darreld Mclean, PA-C 01/19/2019, 12:51 PM

## 2019-01-24 NOTE — Telephone Encounter (Signed)
Per prior note below:  Will route message to Dr. Lovena Le for his input on holding ASA given patient is not on any other anticoagulation.   Please route message back to P CV DIV PREOP

## 2019-01-26 NOTE — Telephone Encounter (Signed)
   Primary Cardiologist: Cristopher Peru, MD  Chart reviewed as part of pre-operative protocol coverage. Patient was contacted 01/26/2019 in reference to pre-operative risk assessment for pending surgery as outlined below.  Brianna French was last seen on 12/14/2017 by me.  Since that day, Dyanni Larri Maqueda has done very well.  She had her weight loss surgery and has now lost 90 pounds.  She is walking about 5 miles per day with no chest pain or shortness of breath.  She still has occasional brief breakthrough palpitations (afib) and this will be discussed at her next office visit on 12/16.   Therefore, based on ACC/AHA guidelines, the patient would be at acceptable risk for the planned procedure without further cardiovascular testing.   Aspirin can be held as needed for the procedure.  I will route this recommendation to the requesting party via Epic fax function and remove from pre-op pool.  Please call with questions.  Daune Perch, NP 01/26/2019, 4:01 PM

## 2019-01-26 NOTE — Telephone Encounter (Signed)
Ok to stop ASA. GT

## 2019-02-07 ENCOUNTER — Other Ambulatory Visit: Payer: Self-pay

## 2019-02-07 ENCOUNTER — Other Ambulatory Visit
Admission: RE | Admit: 2019-02-07 | Discharge: 2019-02-07 | Disposition: A | Discharge status: Home / Self Care | Payer: 59 | Source: Ambulatory Visit | Attending: Gastroenterology | Admitting: Gastroenterology

## 2019-02-07 ENCOUNTER — Other Ambulatory Visit: Payer: 59

## 2019-02-07 ENCOUNTER — Other Ambulatory Visit: Admission: RE | Admit: 2019-02-07 | Payer: 59 | Source: Ambulatory Visit

## 2019-02-07 DIAGNOSIS — Z01812 Encounter for preprocedural laboratory examination: Secondary | ICD-10-CM | POA: Diagnosis not present

## 2019-02-07 DIAGNOSIS — Z20828 Contact with and (suspected) exposure to other viral communicable diseases: Secondary | ICD-10-CM | POA: Diagnosis not present

## 2019-02-08 LAB — SARS CORONAVIRUS 2 (TAT 6-24 HRS): SARS Coronavirus 2: NEGATIVE

## 2019-02-09 ENCOUNTER — Encounter
Admission: RE | Disposition: A | Discharge status: Home / Self Care | Payer: Self-pay | Source: Home / Self Care | Attending: Gastroenterology

## 2019-02-09 ENCOUNTER — Ambulatory Visit: Payer: 59 | Admitting: Anesthesiology

## 2019-02-09 ENCOUNTER — Other Ambulatory Visit: Payer: Self-pay

## 2019-02-09 ENCOUNTER — Ambulatory Visit
Admission: RE | Admit: 2019-02-09 | Discharge: 2019-02-09 | Disposition: A | Discharge status: Home / Self Care | Payer: 59 | Attending: Gastroenterology | Admitting: Gastroenterology

## 2019-02-09 DIAGNOSIS — Z8249 Family history of ischemic heart disease and other diseases of the circulatory system: Secondary | ICD-10-CM | POA: Diagnosis not present

## 2019-02-09 DIAGNOSIS — Z825 Family history of asthma and other chronic lower respiratory diseases: Secondary | ICD-10-CM | POA: Diagnosis not present

## 2019-02-09 DIAGNOSIS — E785 Hyperlipidemia, unspecified: Secondary | ICD-10-CM | POA: Diagnosis not present

## 2019-02-09 DIAGNOSIS — E782 Mixed hyperlipidemia: Secondary | ICD-10-CM | POA: Diagnosis not present

## 2019-02-09 DIAGNOSIS — Z7982 Long term (current) use of aspirin: Secondary | ICD-10-CM | POA: Insufficient documentation

## 2019-02-09 DIAGNOSIS — Z79899 Other long term (current) drug therapy: Secondary | ICD-10-CM | POA: Insufficient documentation

## 2019-02-09 DIAGNOSIS — F329 Major depressive disorder, single episode, unspecified: Secondary | ICD-10-CM | POA: Diagnosis not present

## 2019-02-09 DIAGNOSIS — I471 Supraventricular tachycardia: Secondary | ICD-10-CM | POA: Insufficient documentation

## 2019-02-09 DIAGNOSIS — I10 Essential (primary) hypertension: Secondary | ICD-10-CM | POA: Diagnosis not present

## 2019-02-09 DIAGNOSIS — K648 Other hemorrhoids: Secondary | ICD-10-CM | POA: Diagnosis not present

## 2019-02-09 DIAGNOSIS — K640 First degree hemorrhoids: Secondary | ICD-10-CM | POA: Diagnosis not present

## 2019-02-09 DIAGNOSIS — Z87891 Personal history of nicotine dependence: Secondary | ICD-10-CM | POA: Insufficient documentation

## 2019-02-09 DIAGNOSIS — M1990 Unspecified osteoarthritis, unspecified site: Secondary | ICD-10-CM | POA: Diagnosis not present

## 2019-02-09 DIAGNOSIS — Z818 Family history of other mental and behavioral disorders: Secondary | ICD-10-CM | POA: Diagnosis not present

## 2019-02-09 DIAGNOSIS — G4730 Sleep apnea, unspecified: Secondary | ICD-10-CM | POA: Insufficient documentation

## 2019-02-09 DIAGNOSIS — Z9884 Bariatric surgery status: Secondary | ICD-10-CM | POA: Diagnosis not present

## 2019-02-09 DIAGNOSIS — Z96653 Presence of artificial knee joint, bilateral: Secondary | ICD-10-CM | POA: Insufficient documentation

## 2019-02-09 DIAGNOSIS — I4891 Unspecified atrial fibrillation: Secondary | ICD-10-CM | POA: Diagnosis not present

## 2019-02-09 DIAGNOSIS — Z91041 Radiographic dye allergy status: Secondary | ICD-10-CM | POA: Diagnosis not present

## 2019-02-09 DIAGNOSIS — Z833 Family history of diabetes mellitus: Secondary | ICD-10-CM | POA: Insufficient documentation

## 2019-02-09 DIAGNOSIS — Z887 Allergy status to serum and vaccine status: Secondary | ICD-10-CM | POA: Diagnosis not present

## 2019-02-09 DIAGNOSIS — Z1211 Encounter for screening for malignant neoplasm of colon: Secondary | ICD-10-CM | POA: Insufficient documentation

## 2019-02-09 DIAGNOSIS — Z6830 Body mass index (BMI) 30.0-30.9, adult: Secondary | ICD-10-CM | POA: Diagnosis not present

## 2019-02-09 DIAGNOSIS — E669 Obesity, unspecified: Secondary | ICD-10-CM | POA: Diagnosis not present

## 2019-02-09 DIAGNOSIS — Z809 Family history of malignant neoplasm, unspecified: Secondary | ICD-10-CM | POA: Insufficient documentation

## 2019-02-09 DIAGNOSIS — G4733 Obstructive sleep apnea (adult) (pediatric): Secondary | ICD-10-CM | POA: Diagnosis not present

## 2019-02-09 HISTORY — PX: COLONOSCOPY WITH PROPOFOL: SHX5780

## 2019-02-09 SURGERY — COLONOSCOPY WITH PROPOFOL
Anesthesia: General

## 2019-02-09 MED ORDER — LIDOCAINE 2% (20 MG/ML) 5 ML SYRINGE
INTRAMUSCULAR | Status: DC | PRN
  Administered 2019-02-09: 50 mg via INTRAVENOUS

## 2019-02-09 MED ORDER — PROPOFOL 500 MG/50ML IV EMUL
INTRAVENOUS | Status: DC | PRN
  Administered 2019-02-09: 150 ug/kg/min via INTRAVENOUS

## 2019-02-09 MED ORDER — SODIUM CHLORIDE 0.9 % IV SOLN
INTRAVENOUS | Status: DC
  Administered 2019-02-09: 13:00:00 via INTRAVENOUS

## 2019-02-09 MED ORDER — PROPOFOL 500 MG/50ML IV EMUL
INTRAVENOUS | Status: AC
  Filled 2019-02-09: qty 50

## 2019-02-09 MED ORDER — PROPOFOL 10 MG/ML IV BOLUS
INTRAVENOUS | Status: DC | PRN
  Administered 2019-02-09: 70 mg via INTRAVENOUS
  Administered 2019-02-09: 30 mg via INTRAVENOUS

## 2019-02-09 MED ORDER — EPHEDRINE SULFATE 50 MG/ML IJ SOLN
INTRAMUSCULAR | Status: DC | PRN
  Administered 2019-02-09: 10 mg via INTRAVENOUS

## 2019-02-09 NOTE — Op Note (Signed)
[facility] Patient Name: Brianna French Procedure Date: 02/09/2019 1:17 PM MRN: [id number] Account #: [id number] Date of Birth: 18-Aug-1960 Admit Type: Outpatient Age: 58 Room: [facility] ENDO ROOM 1 Gender: Female Note Status: Finalized Procedure:             Colonoscopy Indications:           Screening for colorectal malignant neoplasm Providers:             Jonathon Bellows MD, MD Medicines:             Monitored Anesthesia Care Complications:         No immediate complications. Procedure:             Pre-Anesthesia Assessment:                        - ASA Grade Assessment: II - A patient with mild                         systemic disease.                        After obtaining informed consent, the colonoscope was                         passed under direct vision. Throughout the procedure,                         the patient's blood pressure, pulse, and oxygen                         saturations were monitored continuously. The                         Colonoscope was introduced through the anus and                         advanced to the the cecum, identified by the                         appendiceal orifice. The colonoscopy was performed                         with ease. The patient tolerated the procedure well.                         The quality of the bowel preparation was excellent. Findings:      The perianal and digital rectal examinations were normal.      Non-bleeding internal hemorrhoids were found during retroflexion. The       hemorrhoids were medium-sized and Grade I (internal hemorrhoids that do       not prolapse).      The exam was otherwise without abnormality on direct and retroflexion       views. Impression:            - Non-bleeding internal hemorrhoids.                        - The examination was otherwise normal on direct and  retroflexion views.                        - No specimens  collected. Recommendation:        - Discharge patient to home (with escort).                        - Resume previous diet.                        - Continue present medications.                        - Repeat colonoscopy in 10 years for screening                         purposes. Procedure Code(s):     --- Professional ---                        [phone], Colonoscopy, flexible; diagnostic, including                         collection of specimen(s) by brushing or washing, when                         performed (separate procedure) Diagnosis Code(s):     --- Professional ---                        Z12.11, Encounter for screening for malignant neoplasm                         of colon                        K64.0, First degree hemorrhoids CPT copyright 2019 American Medical Association. All rights reserved. The codes documented in this report are preliminary and upon coder review may  be revised to meet current compliance requirements. Jonathon Bellows, MD Jonathon Bellows MD, MD 02/09/2019 1:43:26 PM This report has been signed electronically. Number of Addenda: 0 Note Initiated On: 02/09/2019 1:17 PM Scope Withdrawal Time: 0 hours 11 minutes 24 seconds  Total Procedure Duration: 0 hours 17 minutes 9 seconds  Estimated Blood Loss:  Estimated blood loss: none.      [facility]

## 2019-02-09 NOTE — H&P (Signed)
Jonathon Bellows, MD [address] [place], [place], [place], [address] [address], [place], [place], [place], [address] Phone: [phone]  Fax: [phone]  Primary Care Physician:  Idelle Crouch, MD   Pre-Procedure History & Physical: HPI:  Brianna French is a 58 y.o. female is here for an colonoscopy.   Past Medical History:  Diagnosis Date  . Arrhythmia   . Arthritis   . Atrial fibrillation ([place])   . Chest pain    NM Myocardial Scan 08/31/12   . Chickenpox   . Daytime somnolence   . Depression   . Dysrhythmia   . Hyperlipemia   . Mixed hyperlipidemia   . Ovarian cyst   . Palpitations   . Plantar fasciitis of left foot   . PSVT (paroxysmal supraventricular tachycardia) ([place])   . Sleep apnea    on CPAP    Past Surgical History:  Procedure Laterality Date  . APPENDECTOMY  07-16-14   Dr. Marina Gravel  . BILATERAL KNEE ARTHROSCOPY  05/06/10  . HYSTEROSCOPY  12/2017   in office  . KNEE ARTHROSCOPY Right 2007  . LAPAROSCOPIC APPENDECTOMY N/A 07/16/2014   Procedure: APPENDECTOMY LAPAROSCOPIC;  Surgeon: Sherri Rad, MD;  Location: [facility] ORS;  Service: General;  Laterality: N/A;  . LAPAROSCOPIC GASTRIC SLEEVE RESECTION WITH HIATAL HERNIA REPAIR  04/19/2018   Procedure: LAPAROSCOPIC GASTRIC SLEEVE RESECTION WITH HIATAL HERNIA REPAIR;  Surgeon: Greer Pickerel, MD;  Location: [facility] ORS;  Service: General;;  . OVARIAN CYST SURGERY  1980  . REPLACEMENT TOTAL KNEE BILATERAL Bilateral   . TONSILLECTOMY     age 21  . TRIGGER FINGER RELEASE Right 06/06/11   Right ring finger  . UPPER GI ENDOSCOPY  04/19/2018   Procedure: UPPER GI ENDOSCOPY;  Surgeon: Greer Pickerel, MD;  Location: [facility] ORS;  Service: General;;    Prior to Admission medications   Medication Sig Start Date End Date Taking? Authorizing Provider  aspirin EC 81 MG tablet Take 81 mg by mouth daily.   Yes [provider]  Calcium Citrate-Vitamin D (CELEBRATE CALCIUM PLUS 500 PO) Take 500 mg by mouth 3 (three) times  daily.   Yes [provider]  flecainide (TAMBOCOR) 100 MG tablet TAKE 1 TABLET BY MOUTH 2 TIMES DAILY. Patient taking differently: Take 100 mg by mouth 2 (two) times daily.  06/24/16  Yes Evans Lance, MD  Magnesium Oxide (MAG-OXIDE PO) Take 800 mg by mouth every morning.    Yes [provider]  Multiple Vitamins-Minerals (CELEBRATE MULTI-COMPLETE 60 PO) Take 1 tablet by mouth 2 (two) times daily.   Yes [provider]  rosuvastatin (CRESTOR) 10 MG tablet Take 10 mg by mouth every morning. TAKE ONE TABLET BY MOUTH IN THE MORNING 07/27/13  Yes [provider]  venlafaxine XR (EFFEXOR-XR) 150 MG 24 hr capsule Take 150 mg by mouth daily with breakfast.  10/19/13  Yes [provider]  clotrimazole-betamethasone (LOTRISONE) cream Apply 1 application topically daily as needed (rash). APPLY A SMALL AMOUNT TO AFFECTED AREA 2 TIMES A DAY AS DIRECTED 07/27/13   [provider]  polyethylene glycol (MIRALAX / GLYCOLAX) 17 g packet Take 17 g by mouth daily.    [provider]    Allergies as of 01/19/2019 - Review Complete 04/19/2018  Allergen Reaction Noted  . Ivp dye [iodinated diagnostic agents] Other (See Comments) 01/17/2014  . Other Other (See Comments) 01/17/2014  . Pneumococcal vac polyvalent Other (See Comments) 01/17/2014  . Pneumococcal vaccine  Other (See Comments) 03/29/2015    Family History  Problem Relation Age of Onset  . Heart attack Father   . Hypertension Mother   . Diabetes type II Mother   . Asthma Mother   . Mental illness Mother   . Hypertension Sister   . Diabetes type II Sister   . Diabetes type II Other   . Cancer Other   . CAD Other        Fam Hx of CAD  . Breast cancer Neg Hx     Social History   Socioeconomic History  . Marital status: Married    Spouse name: Not on file  . Number of children: Not on file  . Years of education: Not on file  . Highest education level: Not on file  Occupational  History  . Not on file  Social Needs  . Financial resource strain: Not on file  . Food insecurity    Worry: Not on file    Inability: Not on file  . Transportation needs    Medical: Not on file    Non-medical: Not on file  Tobacco Use  . Smoking status: Former Smoker    Types: Cigarettes    Quit date: 03/04/1979    Years since quitting: 39.9  . Smokeless tobacco: Never Used  Substance and Sexual Activity  . Alcohol use: Not Currently    Alcohol/week: 0.0 standard drinks    Comment: occasional sip of wine  . Drug use: Never  . Sexual activity: Yes  Lifestyle  . Physical activity    Days per week: Not on file    Minutes per session: Not on file  . Stress: Not on file  Relationships  . Social Herbalist on phone: Not on file    Gets together: Not on file    Attends religious service: Not on file    Active member of club or organization: Not on file    Attends meetings of clubs or organizations: Not on file    Relationship status: Not on file  . Intimate partner violence    Fear of current or ex partner: Not on file    Emotionally abused: Not on file    Physically abused: Not on file    Forced sexual activity: Not on file  Other Topics Concern  . Not on file  Social History Narrative  . Not on file    Review of Systems: See HPI, otherwise negative ROS  Physical Exam: BP 140/78   Pulse 79   Temp 98.2 F (36.8 C) (Temporal)   Resp 18   Ht 5\' 4"  (1.626 m)   Wt 69.4 kg   SpO2 100%   BMI 26.25 kg/m  General:   Alert,  pleasant and cooperative in NAD Head:  Normocephalic and atraumatic. Neck:  Supple; no masses or thyromegaly. Lungs:  Clear throughout to auscultation, normal respiratory effort.    Heart:  +S1, +S2, Regular rate and rhythm, No edema. Abdomen:  Soft, nontender and nondistended. Normal bowel sounds, without guarding, and without rebound.   Neurologic:  Alert and  oriented x4;  grossly normal neurologically.  Impression/Plan: Brianna French is here for an colonoscopy to be performed for Screening colonoscopy average risk   Risks, benefits, limitations, and alternatives regarding  colonoscopy have been reviewed with the patient.  Questions have been answered.  All parties agreeable.   Jonathon Bellows, MD  02/09/2019, 12:58 PM

## 2019-02-09 NOTE — Anesthesia Post-op Follow-up Note (Signed)
Anesthesia QCDR form completed.        

## 2019-02-09 NOTE — Anesthesia Postprocedure Evaluation (Signed)
Anesthesia Post Note  Patient: Brianna French  Procedure(s) Performed: COLONOSCOPY WITH PROPOFOL (N/A )  Patient location during evaluation: Endoscopy Anesthesia Type: General Level of consciousness: awake and alert and oriented Pain management: pain level controlled Vital Signs Assessment: post-procedure vital signs reviewed and stable Respiratory status: spontaneous breathing, nonlabored ventilation and respiratory function stable Cardiovascular status: blood pressure returned to baseline and stable Postop Assessment: no signs of nausea or vomiting Anesthetic complications: no     Last Vitals:  Vitals:   02/09/19 1344 02/09/19 1350  BP: (!) 155/94   Pulse: 73 78  Resp: 17 15  Temp: (!) 35.7 C   SpO2: 99% 98%    Last Pain:  Vitals:   02/09/19 1354  TempSrc:   PainSc: 0-No pain                 [name]

## 2019-02-09 NOTE — Anesthesia Preprocedure Evaluation (Signed)
Anesthesia Evaluation  Patient identified by MRN, date of birth, ID band Patient awake    Reviewed: Allergy & Precautions, NPO status , Patient's Chart, lab work & pertinent test results  History of Anesthesia Complications Negative for: history of anesthetic complications  Airway Mallampati: II  TM Distance: >3 FB Neck ROM: Full    Dental no notable dental hx. (+) Dental Advisory Given   Pulmonary sleep apnea and Continuous Positive Airway Pressure Ventilation , former smoker,    Pulmonary exam normal        Cardiovascular hypertension, Normal cardiovascular exam+ dysrhythmias Atrial Fibrillation      Neuro/Psych PSYCHIATRIC DISORDERS Depression negative neurological ROS     GI/Hepatic Neg liver ROS, hiatal hernia,   Endo/Other  Morbid obesity  Renal/GU negative Renal ROS  negative genitourinary   Musculoskeletal  (+) Arthritis , Osteoarthritis,    Abdominal   Peds negative pediatric ROS (+)  Hematology negative hematology ROS (+)   Anesthesia Other Findings   Reproductive/Obstetrics negative OB ROS                             Anesthesia Physical  Anesthesia Plan  ASA: III  Anesthesia Plan: General   Post-op Pain Management:    Induction: Intravenous  PONV Risk Score and Plan: 3 and Propofol infusion  Airway Management Planned: Nasal Cannula  Additional Equipment:   Intra-op Plan:   Post-operative Plan:   Informed Consent: I have reviewed the patients History and Physical, chart, labs and discussed the procedure including the risks, benefits and alternatives for the proposed anesthesia with the patient or authorized representative who has indicated his/her understanding and acceptance.     Dental advisory given  Plan Discussed with: CRNA and Anesthesiologist  Anesthesia Plan Comments: (See PST note 04/07/18, Konrad Felix, PA-C)        Anesthesia Quick  Evaluation

## 2019-02-09 NOTE — Transfer of Care (Signed)
Immediate Anesthesia Transfer of Care Note  Patient: Vernessa Gailes  Procedure(s) Performed: COLONOSCOPY WITH PROPOFOL (N/A )  Patient Location: Endoscopy Unit  Anesthesia Type:General  Level of Consciousness: awake, alert  and oriented  Airway & Oxygen Therapy: Patient connected to nasal cannula oxygen  Post-op Assessment: Post -op Vital signs reviewed and stable  Post vital signs: stable  Last Vitals:  Vitals Value Taken Time  BP 155/94 02/09/19 1345  Temp 35.7 C 02/09/19 1344  Pulse 78 02/09/19 1350  Resp 13 02/09/19 1351  SpO2 98 % 02/09/19 1350  Vitals shown include unvalidated device data.  Last Pain:  Vitals:   02/09/19 1344  TempSrc: Temporal  PainSc: 0-No pain         Complications: No apparent anesthesia complications

## 2019-02-10 ENCOUNTER — Encounter: Payer: Self-pay | Admitting: *Deleted

## 2019-02-16 ENCOUNTER — Other Ambulatory Visit: Payer: Self-pay

## 2019-02-16 ENCOUNTER — Encounter: Payer: Self-pay | Admitting: Internal Medicine

## 2019-02-16 ENCOUNTER — Ambulatory Visit: Payer: 59 | Admitting: Internal Medicine

## 2019-02-16 VITALS — BP 112/70 | HR 57 | Ht 64.0 in | Wt 158.0 lb

## 2019-02-16 DIAGNOSIS — I480 Paroxysmal atrial fibrillation: Secondary | ICD-10-CM | POA: Diagnosis not present

## 2019-02-16 DIAGNOSIS — I10 Essential (primary) hypertension: Secondary | ICD-10-CM | POA: Diagnosis not present

## 2019-02-16 NOTE — Patient Instructions (Signed)
Medication Instructions:  Your physician recommends that you continue on your current medications as directed. Please refer to the Current Medication list given to you today. \ *If you need a refill on your cardiac medications before your next appointment, please call your pharmacy*  Lab Work: None ordered.  If you have labs (blood work) drawn today and your tests are completely normal, you will receive your results only by: Marland Kitchen MyChart Message (if you have MyChart) OR . A paper copy in the mail If you have any lab test that is abnormal or we need to change your treatment, we will call you to review the results.  Testing/Procedures: None ordered.   Follow-Up: At [facility], you and your health needs are our priority.  As part of our continuing mission to provide you with exceptional heart care, we have created designated Provider Care Teams.  These Care Teams include your primary Cardiologist (physician) and Advanced Practice Providers (APPs -  Physician Assistants and Nurse Practitioners) who all work together to provide you with the care you need, when you need it.  Your next appointment:  Your physician wants you to follow-up in: 1 Year with Dr Knox Saliva will receive a reminder letter in the mail two months in advance. If you don't receive a letter, please call our office to schedule the follow-up appointment.

## 2019-02-16 NOTE — Progress Notes (Signed)
HPI Mrs. Petermann returns today for followup. She is a pleasant previously morbidly obese woman with PAF, who has undergone bariatric surgery and lost 90 lbs. She feels well. 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 She denies chest pain or sob. She notes occasional palpitations.  Allergies  Allergen Reactions  . Ivp Dye [Iodinated Diagnostic Agents] Other (See Comments)    ALSO VICRYL CAUSES OPEN SORES/BLISTERS  . Other Other (See Comments)    Vicryl suture causes sores/blisters, narcotics make her sick  . Pneumococcal Vac Polyvalent Other (See Comments)    Fever, edema  . Pneumococcal Vaccine Other (See Comments)    Fever, edema     Current Outpatient Medications  Medication Sig Dispense Refill  . aspirin EC 81 MG tablet Take 81 mg by mouth daily.    . Biotin 10000 MCG TABS Take 1 capsule by mouth 3 (three) times a week.    . Calcium Citrate-Vitamin D (CELEBRATE CALCIUM PLUS 500 PO) Take 500 mg by mouth 3 (three) times daily.    . clotrimazole-betamethasone (LOTRISONE) cream Apply 1 application topically daily as needed (rash). APPLY A SMALL AMOUNT TO AFFECTED AREA 2 TIMES A DAY AS DIRECTED    . flecainide (TAMBOCOR) 100 MG tablet TAKE 1 TABLET BY MOUTH 2 TIMES DAILY. (Patient taking differently: Take 100 mg by mouth 2 (two) times daily. ) 180 tablet 0  . Magnesium Oxide (MAG-OXIDE PO) Take 800 mg by mouth every morning.     . Multiple Vitamins-Minerals (CELEBRATE MULTI-COMPLETE 60 PO) Take 1 tablet by mouth 2 (two) times daily.    . rosuvastatin (CRESTOR) 10 MG tablet Take 5 mg by mouth every morning. TAKE ONE TABLET BY MOUTH IN THE MORNING    . venlafaxine XR (EFFEXOR-XR) 150 MG 24 hr capsule Take 150 mg by mouth daily with breakfast.   3   No current facility-administered medications for this visit.     Past Medical History:  Diagnosis Date  . Arrhythmia   . Arthritis   . Atrial fibrillation ([place])   . Chest pain    NM Myocardial Scan 08/31/12   .  Chickenpox   . Daytime somnolence   . Depression   . Dysrhythmia   . Hyperlipemia   . Mixed hyperlipidemia   . Ovarian cyst   . Palpitations   . Plantar fasciitis of left foot   . PSVT (paroxysmal supraventricular tachycardia) ([place])   . Sleep apnea    on CPAP    ROS:   All systems reviewed and negative except as noted in the HPI.   Past Surgical History:  Procedure Laterality Date  . APPENDECTOMY  07-16-14   Dr. Marina Gravel  . BILATERAL KNEE ARTHROSCOPY  05/06/10  . COLONOSCOPY WITH PROPOFOL N/A 02/09/2019   Procedure: COLONOSCOPY WITH PROPOFOL;  Surgeon: Jonathon Bellows, MD;  Location: [facility] ENDOSCOPY;  Service: Gastroenterology;  Laterality: N/A;  . HYSTEROSCOPY  12/2017   in office  . KNEE ARTHROSCOPY Right 2007  . LAPAROSCOPIC APPENDECTOMY N/A 07/16/2014   Procedure: APPENDECTOMY LAPAROSCOPIC;  Surgeon: Sherri Rad, MD;  Location: [facility] ORS;  Service: General;  Laterality: N/A;  . LAPAROSCOPIC GASTRIC SLEEVE RESECTION WITH HIATAL HERNIA REPAIR  04/19/2018   Procedure: LAPAROSCOPIC GASTRIC SLEEVE RESECTION WITH HIATAL HERNIA REPAIR;  Surgeon: Greer Pickerel, MD;  Location: [facility] ORS;  Service: General;;  . OVARIAN CYST SURGERY  1980  . REPLACEMENT TOTAL KNEE BILATERAL Bilateral   . TONSILLECTOMY     age 7  .  TRIGGER FINGER RELEASE Right 06/06/11   Right ring finger  . UPPER GI ENDOSCOPY  04/19/2018   Procedure: UPPER GI ENDOSCOPY;  Surgeon: Greer Pickerel, MD;  Location: [facility] ORS;  Service: General;;     Family History  Problem Relation Age of Onset  . Heart attack Father   . Hypertension Mother   . Diabetes type II Mother   . Asthma Mother   . Mental illness Mother   . Hypertension Sister   . Diabetes type II Sister   . Diabetes type II Other   . Cancer Other   . CAD Other        Fam Hx of CAD  . Breast cancer Neg Hx      Social History   Socioeconomic History  . Marital status: Married    Spouse name: Not on file  . Number of children: Not on file  . Years of education: Not on  file  . Highest education level: Not on file  Occupational History  . Not on file  Tobacco Use  . Smoking status: Former Smoker    Types: Cigarettes    Quit date: 03/04/1979    Years since quitting: 39.9  . Smokeless tobacco: Never Used  Substance and Sexual Activity  . Alcohol use: Not Currently    Alcohol/week: 0.0 standard drinks    Comment: occasional sip of wine  . Drug use: Never  . Sexual activity: Yes  Other Topics Concern  . Not on file  Social History Narrative  . Not on file   Social Determinants of Health   Financial Resource Strain:   . Difficulty of Paying Living Expenses: Not on file  Food Insecurity:   . Worried About Charity fundraiser in the Last Year: Not on file  . Ran Out of Food in the Last Year: Not on file  Transportation Needs:   . Lack of Transportation (Medical): Not on file  . Lack of Transportation (Non-Medical): Not on file  Physical Activity:   . Days of Exercise per Week: Not on file  . Minutes of Exercise per Session: Not on file  Stress:   . Feeling of Stress : Not on file  Social Connections:   . Frequency of Communication with Friends and Family: Not on file  . Frequency of Social Gatherings with Friends and Family: Not on file  . Attends Religious Services: Not on file  . Active Member of Clubs or Organizations: Not on file  . Attends Archivist Meetings: Not on file  . Marital Status: Not on file  Intimate Partner Violence:   . Fear of Current or Ex-Partner: Not on file  . Emotionally Abused: Not on file  . Physically Abused: Not on file  . Sexually Abused: Not on file     BP 112/70   Pulse (!) 57   Ht 5\' 4"  (1.626 m)   Wt 158 lb (71.7 kg)   BMI 27.12 kg/m   Physical Exam:  Well appearing NAD HEENT: Unremarkable Neck:  No JVD, no thyromegally Lymphatics:  No adenopathy Back:  No CVA tenderness Lungs:  Clear HEART:  Regular rate rhythm, no murmurs, no rubs, no clicks Abd:  soft, positive bowel sounds, no  organomegally, no rebound, no guarding Ext:  2 plus pulses, no edema, no cyanosis, no clubbing Skin:  No rashes no nodules Neuro:  CN II through XII intact, motor grossly intact  EKG - nsr    Assess/Plan: 1. PAF - she is  maintaining NSR. She will continue her flecainde. Her ECG looks good.  2. Obesity - she has lost 90 lbs with bariatric surgery. She feels well. 3. Dyslipidemia - she will continue her statin therapy.  Mikle Bosworth.D.

## 2019-03-02 DIAGNOSIS — H524 Presbyopia: Secondary | ICD-10-CM | POA: Diagnosis not present

## 2019-03-14 ENCOUNTER — Ambulatory Visit: Payer: 59 | Attending: Internal Medicine

## 2019-03-14 DIAGNOSIS — Z20822 Contact with and (suspected) exposure to covid-19: Secondary | ICD-10-CM | POA: Diagnosis not present

## 2019-03-15 LAB — NOVEL CORONAVIRUS, NAA: SARS-CoV-2, NAA: NOT DETECTED

## 2019-04-06 DIAGNOSIS — R7303 Prediabetes: Secondary | ICD-10-CM | POA: Diagnosis not present

## 2019-04-06 DIAGNOSIS — Z9884 Bariatric surgery status: Secondary | ICD-10-CM | POA: Diagnosis not present

## 2019-04-06 DIAGNOSIS — G4730 Sleep apnea, unspecified: Secondary | ICD-10-CM | POA: Diagnosis not present

## 2019-04-06 DIAGNOSIS — E663 Overweight: Secondary | ICD-10-CM | POA: Diagnosis not present

## 2019-04-06 DIAGNOSIS — M5030 Other cervical disc degeneration, unspecified cervical region: Secondary | ICD-10-CM | POA: Diagnosis not present

## 2019-04-06 DIAGNOSIS — E785 Hyperlipidemia, unspecified: Secondary | ICD-10-CM | POA: Diagnosis not present

## 2019-07-20 ENCOUNTER — Other Ambulatory Visit: Payer: Self-pay | Admitting: Internal Medicine

## 2019-07-20 DIAGNOSIS — Z1231 Encounter for screening mammogram for malignant neoplasm of breast: Secondary | ICD-10-CM

## 2019-08-03 ENCOUNTER — Ambulatory Visit
Admission: RE | Admit: 2019-08-03 | Discharge: 2019-08-03 | Disposition: A | Discharge status: Home / Self Care | Payer: 59 | Source: Ambulatory Visit | Attending: Internal Medicine | Admitting: Internal Medicine

## 2019-08-03 DIAGNOSIS — Z1231 Encounter for screening mammogram for malignant neoplasm of breast: Secondary | ICD-10-CM | POA: Insufficient documentation

## 2019-08-08 ENCOUNTER — Other Ambulatory Visit: Payer: Self-pay | Admitting: Internal Medicine

## 2019-08-12 ENCOUNTER — Other Ambulatory Visit: Payer: Self-pay | Admitting: Internal Medicine

## 2019-08-12 DIAGNOSIS — Z79899 Other long term (current) drug therapy: Secondary | ICD-10-CM | POA: Diagnosis not present

## 2019-08-12 DIAGNOSIS — R7309 Other abnormal glucose: Secondary | ICD-10-CM | POA: Diagnosis not present

## 2019-08-12 DIAGNOSIS — I480 Paroxysmal atrial fibrillation: Secondary | ICD-10-CM | POA: Diagnosis not present

## 2019-08-12 DIAGNOSIS — E782 Mixed hyperlipidemia: Secondary | ICD-10-CM | POA: Diagnosis not present

## 2019-08-17 ENCOUNTER — Other Ambulatory Visit
Admission: RE | Admit: 2019-08-17 | Discharge: 2019-08-17 | Disposition: A | Discharge status: Home / Self Care | Payer: 59 | Attending: Internal Medicine | Admitting: Internal Medicine

## 2019-08-17 DIAGNOSIS — E782 Mixed hyperlipidemia: Secondary | ICD-10-CM | POA: Insufficient documentation

## 2019-08-17 DIAGNOSIS — Z79899 Other long term (current) drug therapy: Secondary | ICD-10-CM | POA: Insufficient documentation

## 2019-08-17 DIAGNOSIS — R7309 Other abnormal glucose: Secondary | ICD-10-CM | POA: Diagnosis not present

## 2019-08-17 LAB — CBC WITH DIFFERENTIAL/PLATELET
Abs Immature Granulocytes: 0.02 10*3/uL (ref 0.00–0.07)
Basophils Absolute: 0 10*3/uL (ref 0.0–0.1)
Basophils Relative: 1 %
Eosinophils Absolute: 0.1 10*3/uL (ref 0.0–0.5)
Eosinophils Relative: 1 %
HCT: 40.9 % (ref 36.0–46.0)
Hemoglobin: 13.6 g/dL (ref 12.0–15.0)
Immature Granulocytes: 0 %
Lymphocytes Relative: 26 %
Lymphs Abs: 1.6 10*3/uL (ref 0.7–4.0)
MCH: 32.3 pg (ref 26.0–34.0)
MCHC: 33.3 g/dL (ref 30.0–36.0)
MCV: 97.1 fL (ref 80.0–100.0)
Monocytes Absolute: 0.9 10*3/uL (ref 0.1–1.0)
Monocytes Relative: 14 %
Neutro Abs: 3.5 10*3/uL (ref 1.7–7.7)
Neutrophils Relative %: 58 %
Platelets: 237 10*3/uL (ref 150–400)
RBC: 4.21 MIL/uL (ref 3.87–5.11)
RDW: 12.5 % (ref 11.5–15.5)
WBC: 6.1 10*3/uL (ref 4.0–10.5)
nRBC: 0 % (ref 0.0–0.2)

## 2019-08-17 LAB — COMPREHENSIVE METABOLIC PANEL
ALT: 36 U/L (ref 0–44)
AST: 30 U/L (ref 15–41)
Albumin: 4.1 g/dL (ref 3.5–5.0)
Alkaline Phosphatase: 68 U/L (ref 38–126)
Anion gap: 6 (ref 5–15)
BUN: 17 mg/dL (ref 6–20)
CO2: 31 mmol/L (ref 22–32)
Calcium: 9.6 mg/dL (ref 8.9–10.3)
Chloride: 102 mmol/L (ref 98–111)
Creatinine, Ser: 0.78 mg/dL (ref 0.44–1.00)
GFR calc Af Amer: >60 mL/min (ref >60)
GFR calc non Af Amer: >60 mL/min (ref >60)
Glucose, Bld: 106 mg/dL — ABNORMAL HIGH (ref 70–99)
Potassium: 4.2 mmol/L (ref 3.5–5.1)
Sodium: 139 mmol/L (ref 135–145)
Total Bilirubin: 0.7 mg/dL (ref 0.3–1.2)
Total Protein: 7.1 g/dL (ref 6.5–8.1)

## 2019-08-17 LAB — URINALYSIS, COMPLETE (UACMP) WITH MICROSCOPIC
Bacteria, UA: NONE SEEN
Bilirubin Urine: NEGATIVE
Glucose, UA: NEGATIVE mg/dL
Hgb urine dipstick: NEGATIVE
Ketones, ur: NEGATIVE mg/dL
Leukocytes,Ua: NEGATIVE
Nitrite: NEGATIVE
Protein, ur: NEGATIVE mg/dL
Specific Gravity, Urine: 1.021 (ref 1.005–1.030)
Squamous Epithelial / HPF: NONE SEEN (ref 0–5)
pH: 7 (ref 5.0–8.0)

## 2019-08-17 LAB — LIPID PANEL
Cholesterol: 223 mg/dL — ABNORMAL HIGH (ref 0–200)
HDL: 93 mg/dL (ref >40)
LDL Cholesterol: 117 mg/dL — ABNORMAL HIGH (ref 0–99)
Total CHOL/HDL Ratio: 2.4 RATIO
Triglycerides: 65 mg/dL (ref <150)
VLDL: 13 mg/dL (ref 0–40)

## 2019-08-17 LAB — TSH: TSH: 2.567 u[IU]/mL (ref 0.350–4.500)

## 2019-08-18 LAB — HEMOGLOBIN A1C
Hgb A1c MFr Bld: 5.2 % (ref 4.8–5.6)
Mean Plasma Glucose: 103 mg/dL

## 2019-10-24 ENCOUNTER — Other Ambulatory Visit: Payer: Self-pay | Admitting: Internal Medicine

## 2019-12-16 ENCOUNTER — Other Ambulatory Visit: Payer: Self-pay | Admitting: Internal Medicine

## 2019-12-16 ENCOUNTER — Ambulatory Visit: Payer: 59 | Attending: Internal Medicine

## 2019-12-16 DIAGNOSIS — Z23 Encounter for immunization: Secondary | ICD-10-CM

## 2019-12-16 NOTE — Progress Notes (Signed)
   Covid-19 Vaccination Clinic  Name:  Vernice Mannina    MRN: [id number] DOB: April 03, 1960  12/16/2019  Ms. Duty was observed post Covid-19 immunization for 15 minutes without incident. She was provided with Vaccine Information Sheet and instruction to access the V-Safe system.   Ms. Seville was instructed to call 911 with any severe reactions post vaccine: Marland Kitchen Difficulty breathing  . Swelling of face and throat  . A fast heartbeat  . A bad rash all over body  . Dizziness and weakness

## 2019-12-22 DIAGNOSIS — Z96653 Presence of artificial knee joint, bilateral: Secondary | ICD-10-CM | POA: Diagnosis not present

## 2019-12-22 DIAGNOSIS — M25562 Pain in left knee: Secondary | ICD-10-CM | POA: Diagnosis not present

## 2019-12-22 DIAGNOSIS — M25561 Pain in right knee: Secondary | ICD-10-CM | POA: Diagnosis not present

## 2020-01-27 ENCOUNTER — Ambulatory Visit: Payer: 59

## 2020-01-30 ENCOUNTER — Other Ambulatory Visit: Payer: Self-pay | Admitting: Internal Medicine

## 2020-02-14 ENCOUNTER — Other Ambulatory Visit: Payer: Self-pay | Admitting: Internal Medicine

## 2020-02-14 DIAGNOSIS — I480 Paroxysmal atrial fibrillation: Secondary | ICD-10-CM | POA: Diagnosis not present

## 2020-02-14 DIAGNOSIS — Z79899 Other long term (current) drug therapy: Secondary | ICD-10-CM | POA: Diagnosis not present

## 2020-02-14 DIAGNOSIS — R7309 Other abnormal glucose: Secondary | ICD-10-CM | POA: Diagnosis not present

## 2020-02-14 DIAGNOSIS — E782 Mixed hyperlipidemia: Secondary | ICD-10-CM | POA: Diagnosis not present

## 2020-05-04 ENCOUNTER — Other Ambulatory Visit
Admission: RE | Admit: 2020-05-04 | Discharge: 2020-05-04 | Disposition: A | Discharge status: Home / Self Care | Payer: 59 | Source: Ambulatory Visit | Attending: Internal Medicine | Admitting: Internal Medicine

## 2020-05-04 ENCOUNTER — Other Ambulatory Visit: Payer: Self-pay | Admitting: General Surgery

## 2020-05-04 DIAGNOSIS — R5383 Other fatigue: Secondary | ICD-10-CM | POA: Diagnosis not present

## 2020-05-04 DIAGNOSIS — R7309 Other abnormal glucose: Secondary | ICD-10-CM | POA: Diagnosis not present

## 2020-05-04 DIAGNOSIS — E782 Mixed hyperlipidemia: Secondary | ICD-10-CM | POA: Insufficient documentation

## 2020-05-04 DIAGNOSIS — Z79899 Other long term (current) drug therapy: Secondary | ICD-10-CM | POA: Insufficient documentation

## 2020-05-04 DIAGNOSIS — E663 Overweight: Secondary | ICD-10-CM | POA: Diagnosis not present

## 2020-05-04 DIAGNOSIS — E785 Hyperlipidemia, unspecified: Secondary | ICD-10-CM | POA: Diagnosis not present

## 2020-05-04 DIAGNOSIS — Z9884 Bariatric surgery status: Secondary | ICD-10-CM | POA: Diagnosis not present

## 2020-05-04 LAB — LIPID PANEL
Cholesterol: 232 mg/dL — ABNORMAL HIGH (ref 0–200)
HDL: 99 mg/dL (ref >40)
LDL Cholesterol: 118 mg/dL — ABNORMAL HIGH (ref 0–99)
Total CHOL/HDL Ratio: 2.3 RATIO
Triglycerides: 77 mg/dL (ref <150)
VLDL: 15 mg/dL (ref 0–40)

## 2020-05-04 LAB — URINALYSIS, ROUTINE W REFLEX MICROSCOPIC
Bacteria, UA: NONE SEEN
Bilirubin Urine: NEGATIVE
Glucose, UA: NEGATIVE mg/dL
Ketones, ur: NEGATIVE mg/dL
Leukocytes,Ua: NEGATIVE
Nitrite: NEGATIVE
Protein, ur: NEGATIVE mg/dL
Specific Gravity, Urine: 1.02 (ref 1.005–1.030)
pH: 6 (ref 5.0–8.0)

## 2020-05-04 LAB — CBC WITH DIFFERENTIAL/PLATELET
Abs Immature Granulocytes: 0.02 10*3/uL (ref 0.00–0.07)
Basophils Absolute: 0 10*3/uL (ref 0.0–0.1)
Basophils Relative: 1 %
Eosinophils Absolute: 0.2 10*3/uL (ref 0.0–0.5)
Eosinophils Relative: 5 %
HCT: 44.3 % (ref 36.0–46.0)
Hemoglobin: 14.3 g/dL (ref 12.0–15.0)
Immature Granulocytes: 1 %
Lymphocytes Relative: 37 %
Lymphs Abs: 1.5 10*3/uL (ref 0.7–4.0)
MCH: 31.5 pg (ref 26.0–34.0)
MCHC: 32.3 g/dL (ref 30.0–36.0)
MCV: 97.6 fL (ref 80.0–100.0)
Monocytes Absolute: 0.5 10*3/uL (ref 0.1–1.0)
Monocytes Relative: 13 %
Neutro Abs: 1.8 10*3/uL (ref 1.7–7.7)
Neutrophils Relative %: 43 %
Platelets: 237 10*3/uL (ref 150–400)
RBC: 4.54 MIL/uL (ref 3.87–5.11)
RDW: 12.3 % (ref 11.5–15.5)
WBC: 4.1 10*3/uL (ref 4.0–10.5)
nRBC: 0 % (ref 0.0–0.2)

## 2020-05-04 LAB — COMPREHENSIVE METABOLIC PANEL
ALT: 23 U/L (ref 0–44)
AST: 23 U/L (ref 15–41)
Albumin: 4.2 g/dL (ref 3.5–5.0)
Alkaline Phosphatase: 72 U/L (ref 38–126)
Anion gap: 9 (ref 5–15)
BUN: 18 mg/dL (ref 8–23)
CO2: 28 mmol/L (ref 22–32)
Calcium: 9.5 mg/dL (ref 8.9–10.3)
Chloride: 104 mmol/L (ref 98–111)
Creatinine, Ser: 0.74 mg/dL (ref 0.44–1.00)
GFR, Estimated: >60 mL/min (ref >60)
Glucose, Bld: 97 mg/dL (ref 70–99)
Potassium: 4.3 mmol/L (ref 3.5–5.1)
Sodium: 141 mmol/L (ref 135–145)
Total Bilirubin: 0.8 mg/dL (ref 0.3–1.2)
Total Protein: 7.3 g/dL (ref 6.5–8.1)

## 2020-05-04 LAB — TSH: TSH: 3.141 u[IU]/mL (ref 0.350–4.500)

## 2020-05-04 LAB — HEMOGLOBIN A1C
Hgb A1c MFr Bld: 5.7 % — ABNORMAL HIGH (ref 4.8–5.6)
Mean Plasma Glucose: 116.89 mg/dL

## 2020-05-07 ENCOUNTER — Other Ambulatory Visit
Admission: RE | Admit: 2020-05-07 | Discharge: 2020-05-07 | Disposition: A | Discharge status: Home / Self Care | Payer: 59 | Attending: General Surgery | Admitting: General Surgery

## 2020-05-07 DIAGNOSIS — R5383 Other fatigue: Secondary | ICD-10-CM | POA: Diagnosis not present

## 2020-05-07 LAB — IRON AND TIBC
Iron: 131 ug/dL (ref 28–170)
Saturation Ratios: 40 % — ABNORMAL HIGH (ref 10.4–31.8)
TIBC: 328 ug/dL (ref 250–450)
UIBC: 197 ug/dL

## 2020-05-07 LAB — FERRITIN: Ferritin: 56 ng/mL (ref 11–307)

## 2020-05-09 ENCOUNTER — Other Ambulatory Visit: Payer: Self-pay | Admitting: Internal Medicine

## 2020-05-09 DIAGNOSIS — R7309 Other abnormal glucose: Secondary | ICD-10-CM | POA: Diagnosis not present

## 2020-05-09 DIAGNOSIS — I480 Paroxysmal atrial fibrillation: Secondary | ICD-10-CM | POA: Diagnosis not present

## 2020-05-09 DIAGNOSIS — E7800 Pure hypercholesterolemia, unspecified: Secondary | ICD-10-CM | POA: Diagnosis not present

## 2020-05-09 DIAGNOSIS — Z1231 Encounter for screening mammogram for malignant neoplasm of breast: Secondary | ICD-10-CM

## 2020-05-09 DIAGNOSIS — L299 Pruritus, unspecified: Secondary | ICD-10-CM | POA: Diagnosis not present

## 2020-06-08 IMAGING — MR MR CERVICAL SPINE W/O CM
5 series · 40 of 48 positions shown · non-contrast
Comparison: Report from cervical spine radiographs dated 09/23/2016

CLINICAL DATA: Left neck pain and left arm pain with numbness and
tingling for 2 months.

EXAM:
MRI CERVICAL SPINE WITHOUT CONTRAST
TECHNIQUE: Multiplanar, multisequence MR imaging of the cervical spine was
performed. No intravenous contrast was administered.

[Series 5: T2 · sagittal · 3.0mm · 0.69mm/px · 6 of 15 slices shown (1 of 2)]
[im 1/15]
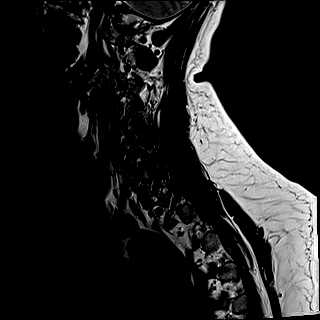
[im 3/15]
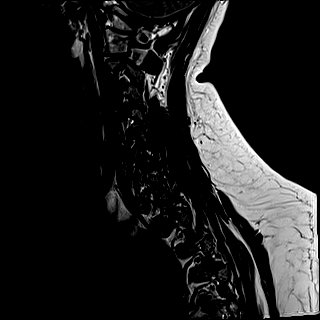
[im 6/15]
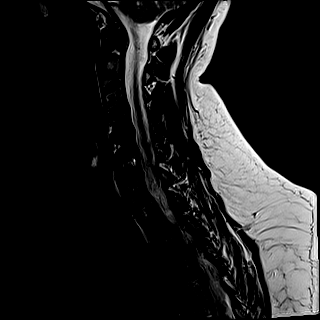
[im 9/15]
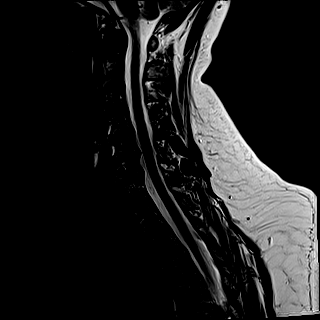
[im 12/15]
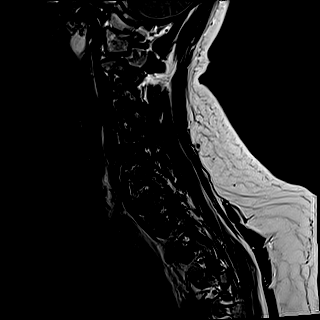
[im 15/15]
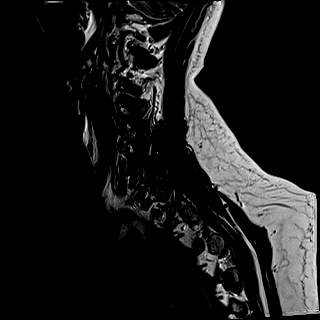

[Series 6: FLAIR · sagittal · 3.0mm · 0.86mm/px · 7 of 15 slices shown]
[im 1/15]
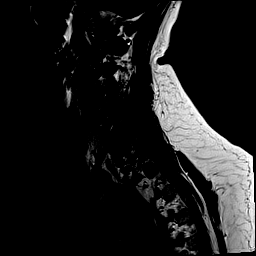
[im 3/15]
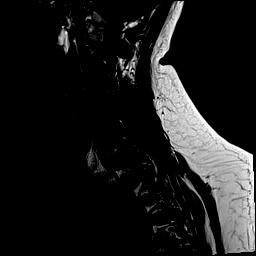
[im 5/15]
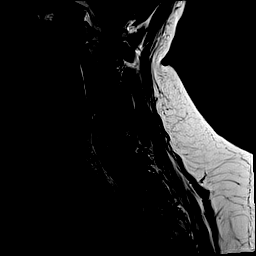
[im 8/15]
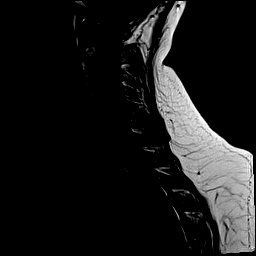
[im 10/15]
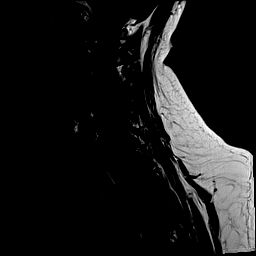
[im 12/15]
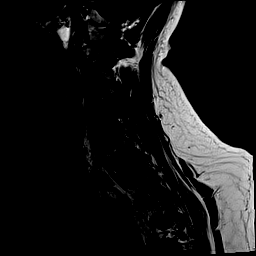
[im 15/15]
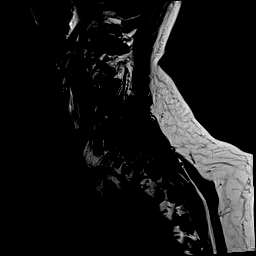

[Series 7: STIR · sagittal · 3.0mm · 0.69mm/px · 7 of 15 slices shown]
[im 1/15]
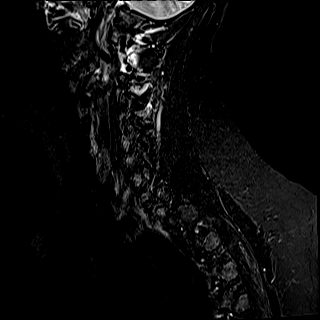
[im 3/15]
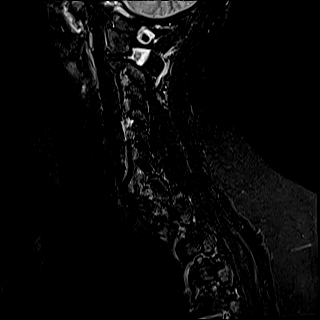
[im 5/15]
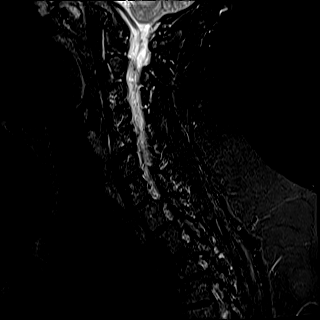
[im 8/15]
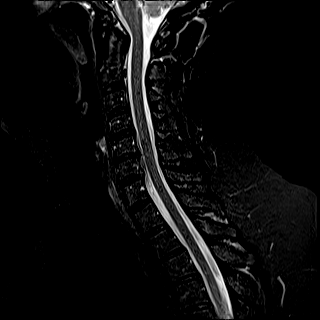
[im 10/15]
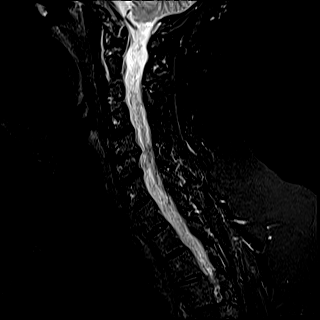
[im 12/15]
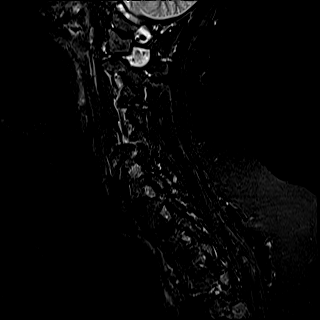
[im 15/15]
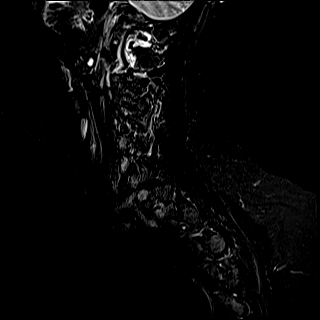

[Series 8: T2 · axial · 3.0mm · 0.70mm/px · z∈[-98,-1]mm · 12 of 30 slices shown (2 of 2)]
[im 1/30]
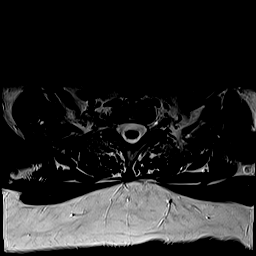
[im 3/30]
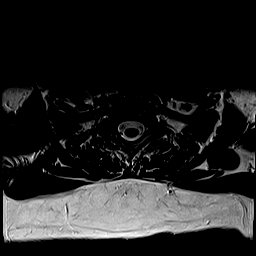
[im 5/30]
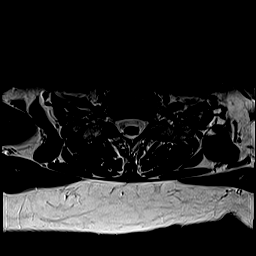
[im 7/30]
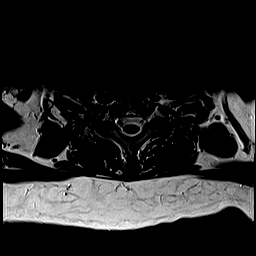
[im 9/30]
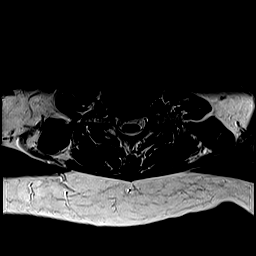
[im 12/30]
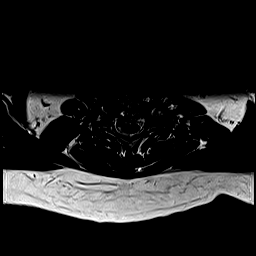
[im 14/30]
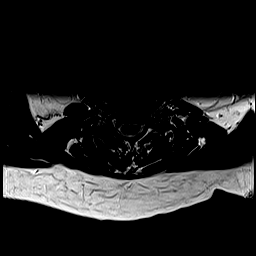
[im 16/30]
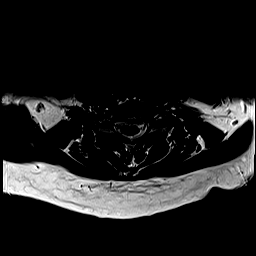
[im 18/30]
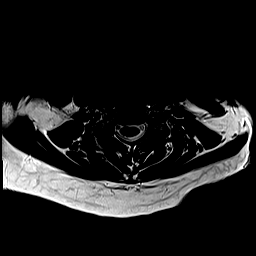
[im 21/30]
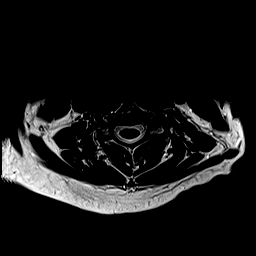
[im 25/30]
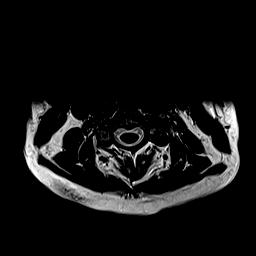
[im 30/30]
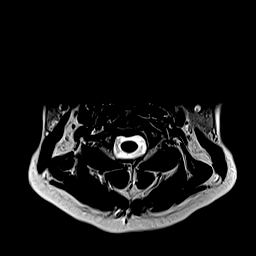

[Series 9: ax mpgr · axial · 3.0mm · 0.35mm/px · z∈[-98,-1]mm · 8 of 30 slices shown]
[im 1/30]
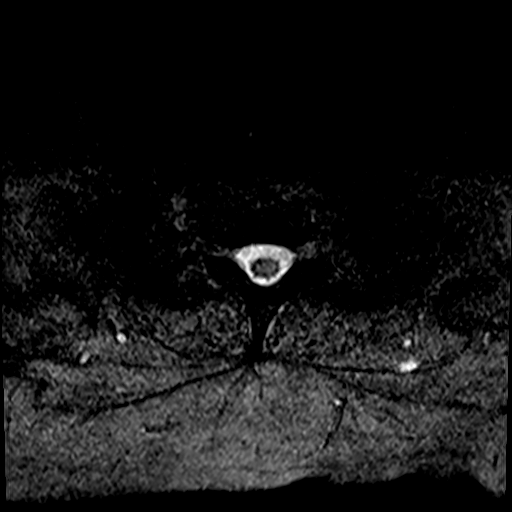
[im 5/30]
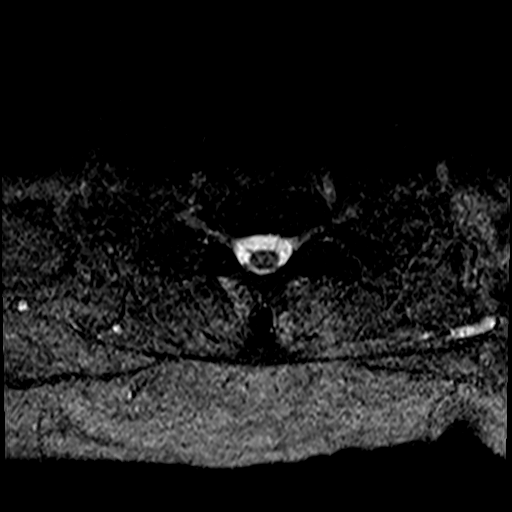
[im 9/30]
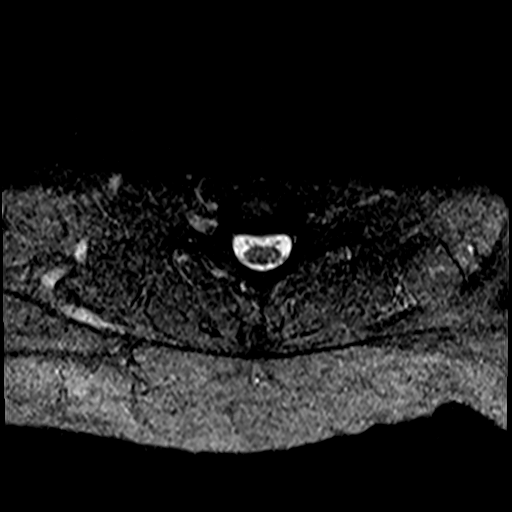
[im 14/30]
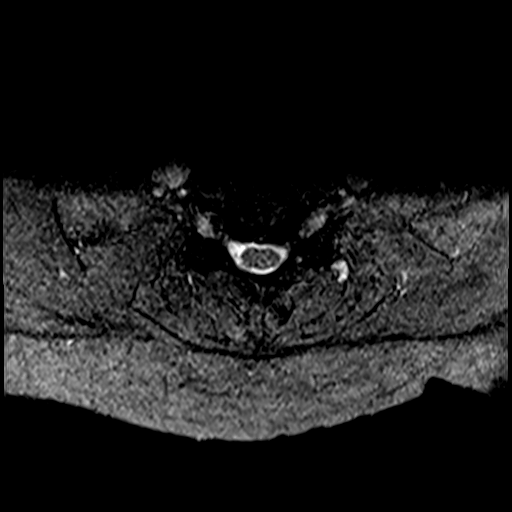
[im 16/30]
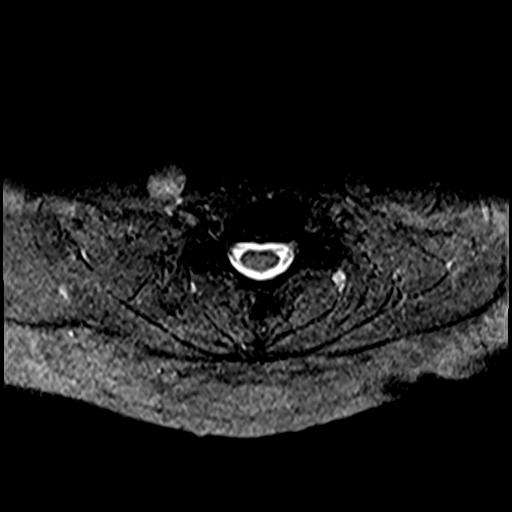
[im 21/30]
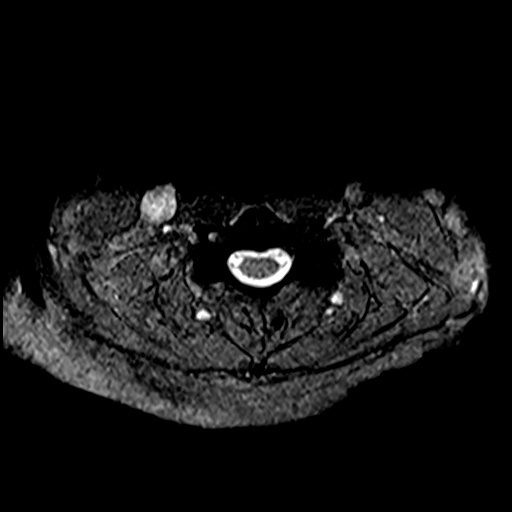
[im 25/30]
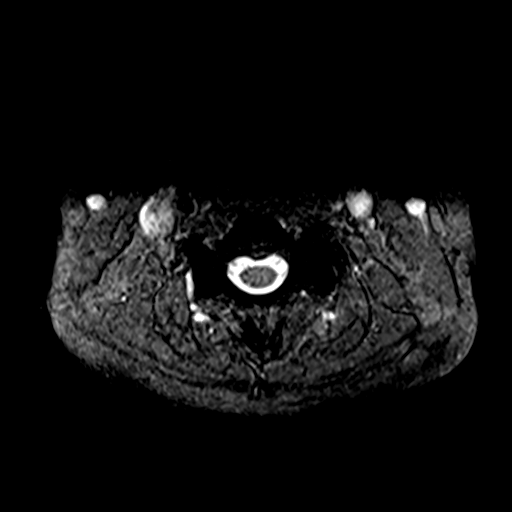
[im 30/30]
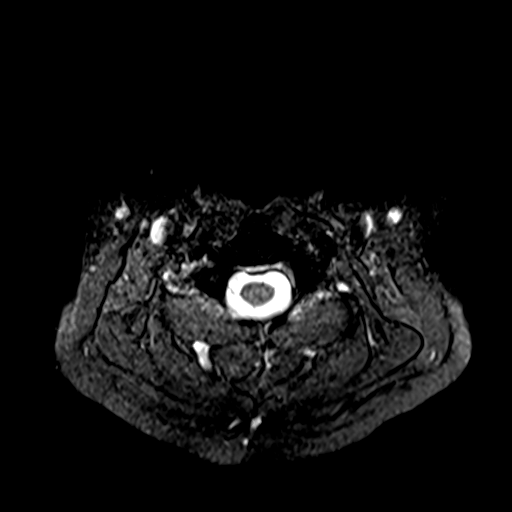

[40 of 48 positions shown; findings below may reference images not displayed]

FINDINGS: Alignment: No vertebral subluxation is observed.

Vertebrae: Mild type 1 degenerative endplate findings at C5-6. Disc
desiccation at all cervical spine levels with loss of disc height
most notable at C5-6.

Cord: No significant abnormal spinal cord signal is observed.

Posterior Fossa, vertebral arteries, paraspinal tissues:
Unremarkable

Disc levels:

C2-3: No impingement.  Mild left facet arthropathy.

C3-4: Mild left foraminal stenosis due to facet arthropathy and
uncinate spurring.

C4-5: No impingement.  Disc bulge and uncinate spurring noted.

C5-6: Prominent left and mild right foraminal stenosis and
borderline central narrowing of the thecal sac due to left uncinate
spur, disc bulge, and small right uncinate spur.

C6-7: Mild right and borderline left foraminal stenosis primarily
due to disc bulge. Small left paracentral annular tear.

C7-T1: Unremarkable.
IMPRESSION: 1. Cervical spondylosis and degenerative disc disease, causing
prominent impingement at C5-6; and mild impingement at C3-4 and
C6-7, as detailed above.

## 2020-06-17 MED FILL — Phentermine HCl Tab 37.5 MG: ORAL | 30 days supply | Qty: 30 | Fill #0 | Status: AC

## 2020-06-18 ENCOUNTER — Other Ambulatory Visit: Payer: Self-pay

## 2020-06-22 ENCOUNTER — Other Ambulatory Visit: Payer: Self-pay

## 2020-06-22 ENCOUNTER — Encounter: Payer: Self-pay | Admitting: Internal Medicine

## 2020-06-22 ENCOUNTER — Ambulatory Visit: Payer: 59 | Admitting: Internal Medicine

## 2020-06-22 VITALS — BP 116/68 | HR 63 | Ht 64.0 in | Wt 172.0 lb

## 2020-06-22 DIAGNOSIS — I480 Paroxysmal atrial fibrillation: Secondary | ICD-10-CM | POA: Diagnosis not present

## 2020-06-22 NOTE — Progress Notes (Signed)
HPI Mrs. Mapes returns today for followup. She is a pleasant previously morbidly obese woman with PAF, who has undergone bariatric surgery and lost 90 lbs. But over the past 18 months has gained back 15 lbs. She feels well. 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 She denies chest pain or sob. She notes occasional palpitations. She came off of her flecainide for 2 days and had a marked increase in her afib symptoms. She is better but thinks that her atrial fib is a little more prominent.  Allergies  Allergen Reactions  . Ivp Dye [Iodinated Diagnostic Agents] Other (See Comments)    ALSO VICRYL CAUSES OPEN SORES/BLISTERS  . Other Other (See Comments)    Vicryl suture causes sores/blisters, narcotics make her sick  . Pneumococcal Vac Polyvalent Other (See Comments)    Fever, edema  . Pneumococcal Vaccine Other (See Comments)    Fever, edema     Current Outpatient Medications  Medication Sig Dispense Refill  . aspirin EC 81 MG tablet Take 81 mg by mouth daily.    . Biotin 10000 MCG TABS Take 1 capsule by mouth 3 (three) times a week.    . Calcium Citrate-Vitamin D (CELEBRATE CALCIUM PLUS 500 PO) Take 500 mg by mouth 3 (three) times daily.    . clotrimazole-betamethasone (LOTRISONE) cream Apply 1 application topically daily as needed (rash). APPLY A SMALL AMOUNT TO AFFECTED AREA 2 TIMES A DAY AS DIRECTED    . clotrimazole-betamethasone (LOTRISONE) cream APPLY TOPICALLY 2 TIMES DAILY. 60 g 3  . COVID-19 mRNA vaccine, Pfizer, 30 MCG/0.3ML injection USE AS DIRECTED .3 mL 0  . flecainide (TAMBOCOR) 100 MG tablet TAKE 1 TABLET BY MOUTH 2 TIMES DAILY. 180 tablet 0  . Magnesium Oxide (MAG-OXIDE PO) Take 800 mg by mouth every morning.     . Multiple Vitamins-Minerals (CELEBRATE MULTI-COMPLETE 60 PO) Take 1 tablet by mouth 2 (two) times daily.    . phentermine (ADIPEX-P) 37.5 MG tablet TAKE 1 TABLET BY MOUTH EVERY MORNING BEFORE BREAKFAST 30 tablet 2  . rosuvastatin (CRESTOR)  10 MG tablet Take 5 mg by mouth every morning. TAKE ONE TABLET BY MOUTH IN THE MORNING    . venlafaxine XR (EFFEXOR-XR) 150 MG 24 hr capsule Take 150 mg by mouth daily with breakfast.   3   No current facility-administered medications for this visit.     Past Medical History:  Diagnosis Date  . Arrhythmia   . Arthritis   . Atrial fibrillation ([place])   . Chest pain    NM Myocardial Scan 08/31/12   . Chickenpox   . Daytime somnolence   . Depression   . Dysrhythmia   . Hyperlipemia   . Mixed hyperlipidemia   . Ovarian cyst   . Palpitations   . Plantar fasciitis of left foot   . PSVT (paroxysmal supraventricular tachycardia) ([place])   . Sleep apnea    on CPAP    ROS:   All systems reviewed and negative except as noted in the HPI.   Past Surgical History:  Procedure Laterality Date  . APPENDECTOMY  07-16-14   Dr. Marina Gravel  . BILATERAL KNEE ARTHROSCOPY  05/06/10  . COLONOSCOPY WITH PROPOFOL N/A 02/09/2019   Procedure: COLONOSCOPY WITH PROPOFOL;  Surgeon: Jonathon Bellows, MD;  Location: [facility] ENDOSCOPY;  Service: Gastroenterology;  Laterality: N/A;  . HYSTEROSCOPY  12/2017   in office  . KNEE ARTHROSCOPY Right 2007  . LAPAROSCOPIC APPENDECTOMY N/A 07/16/2014   Procedure:  APPENDECTOMY LAPAROSCOPIC;  Surgeon: Sherri Rad, MD;  Location: [facility] ORS;  Service: General;  Laterality: N/A;  . LAPAROSCOPIC GASTRIC SLEEVE RESECTION WITH HIATAL HERNIA REPAIR  04/19/2018   Procedure: LAPAROSCOPIC GASTRIC SLEEVE RESECTION WITH HIATAL HERNIA REPAIR;  Surgeon: Greer Pickerel, MD;  Location: [facility] ORS;  Service: General;;  . OVARIAN CYST SURGERY  1980  . REPLACEMENT TOTAL KNEE BILATERAL Bilateral   . TONSILLECTOMY     age 19  . TRIGGER FINGER RELEASE Right 06/06/11   Right ring finger  . UPPER GI ENDOSCOPY  04/19/2018   Procedure: UPPER GI ENDOSCOPY;  Surgeon: Greer Pickerel, MD;  Location: [facility] ORS;  Service: General;;     Family History  Problem Relation Age of Onset  . Heart attack Father   . Hypertension  Mother   . Diabetes type II Mother   . Asthma Mother   . Mental illness Mother   . Hypertension Sister   . Diabetes type II Sister   . Diabetes type II Other   . Cancer Other   . CAD Other        Fam Hx of CAD  . Breast cancer Neg Hx      Social History   Socioeconomic History  . Marital status: Married    Spouse name: Not on file  . Number of children: Not on file  . Years of education: Not on file  . Highest education level: Not on file  Occupational History  . Not on file  Tobacco Use  . Smoking status: Former Smoker    Types: Cigarettes    Quit date: 03/04/1979    Years since quitting: 41.3  . Smokeless tobacco: Never Used  Vaping Use  . Vaping Use: Never used  Substance and Sexual Activity  . Alcohol use: Not Currently    Alcohol/week: 0.0 standard drinks    Comment: occasional sip of wine  . Drug use: Never  . Sexual activity: Yes  Other Topics Concern  . Not on file  Social History Narrative  . Not on file   Social Determinants of Health   Financial Resource Strain: Not on file  Food Insecurity: Not on file  Transportation Needs: Not on file  Physical Activity: Not on file  Stress: Not on file  Social Connections: Not on file  Intimate Partner Violence: Not on file     BP 116/68 (BP Location: Left Arm, Patient Position: Sitting, Cuff Size: Normal)   Pulse 63   Ht 5\' 4"  (1.626 m)   Wt 172 lb (78 kg)   SpO2 98%   BMI 29.52 kg/m   Physical Exam:  Well appearing NAD HEENT: Unremarkable Neck:  No JVD, no thyromegally Lymphatics:  No adenopathy Back:  No CVA tenderness Lungs:  Clear with no wheezes HEART:  Regular rate rhythm, no murmurs, no rubs, no clicks Abd:  soft, positive bowel sounds, no organomegally, no rebound, no guarding Ext:  2 plus pulses, no edema, no cyanosis, no clubbing Skin:  No rashes no nodules Neuro:  CN II through XII intact, motor grossly intact  EKG - nsr  Assess/Plan: 1. PAF - she has had a little uptick in her  symptoms. I encouraged her to lose weight. We discussed snoring which she does on her back. We discussed adding a beta blocker. She would like to hold off for now. We will not increase her dose of flecainide.  2. Obesity - she has gained 15 lbs and I have encouraged her to lose the weight.  We discussed intermittent fasting. 3. Sleep apnea - she had been on CPAP when she was much heavier. I encouraged her to be rechecked if her snoring were to worsen or she gained more weight. 4. Dyslipidemia - she will continue her statin.   Carleene Overlie Jadence Kinlaw,MD

## 2020-06-22 NOTE — Patient Instructions (Signed)

## 2020-06-26 NOTE — Addendum Note (Signed)
Addended by: Maren Beach, Taelor Waymire A on: 06/26/2020 09:13 AM   Modules accepted: Orders

## 2020-06-28 ENCOUNTER — Other Ambulatory Visit (HOSPITAL_COMMUNITY): Payer: Self-pay

## 2020-08-17 ENCOUNTER — Other Ambulatory Visit: Payer: Self-pay

## 2020-08-17 ENCOUNTER — Ambulatory Visit: Payer: 59

## 2020-08-17 MED ORDER — PFIZER-BIONT COVID-19 VAC-TRIS 30 MCG/0.3ML IM SUSP
INTRAMUSCULAR | 0 refills | Status: AC
  Filled 2020-08-17: qty 0.3, 15d supply, fill #0
  Filled 2020-08-24: qty 0.3, 1d supply, fill #0

## 2020-08-19 ENCOUNTER — Other Ambulatory Visit: Payer: Self-pay

## 2020-08-21 ENCOUNTER — Other Ambulatory Visit: Payer: Self-pay

## 2020-08-22 ENCOUNTER — Other Ambulatory Visit: Payer: Self-pay

## 2020-08-22 ENCOUNTER — Ambulatory Visit
Admission: RE | Admit: 2020-08-22 | Discharge: 2020-08-22 | Disposition: A | Discharge status: Home / Self Care | Payer: 59 | Source: Ambulatory Visit | Attending: Internal Medicine | Admitting: Internal Medicine

## 2020-08-22 DIAGNOSIS — Z1231 Encounter for screening mammogram for malignant neoplasm of breast: Secondary | ICD-10-CM | POA: Insufficient documentation

## 2020-08-22 MED ORDER — CLOTRIMAZOLE-BETAMETHASONE 1-0.05 % EX CREA
TOPICAL_CREAM | CUTANEOUS | 3 refills | Status: AC
  Filled 2020-08-22: qty 60, 30d supply, fill #0
  Filled 2020-11-21 – 2020-11-22 (×2): qty 60, 30d supply, fill #1
  Filled 2021-02-28: qty 45, 22d supply, fill #2

## 2020-08-23 ENCOUNTER — Other Ambulatory Visit: Payer: Self-pay

## 2020-08-24 ENCOUNTER — Ambulatory Visit: Payer: 59 | Attending: Internal Medicine

## 2020-08-24 ENCOUNTER — Other Ambulatory Visit: Payer: Self-pay

## 2020-08-24 DIAGNOSIS — Z23 Encounter for immunization: Secondary | ICD-10-CM

## 2020-08-24 MED FILL — Flecainide Acetate Tab 100 MG: ORAL | 90 days supply | Qty: 180 | Fill #0 | Status: AC

## 2020-08-24 MED FILL — Rosuvastatin Calcium Tab 10 MG: ORAL | 90 days supply | Qty: 90 | Fill #0 | Status: AC

## 2020-08-24 NOTE — Progress Notes (Signed)
   Covid-19 Vaccination Clinic  Name:  Brianna French    MRN: [id number] DOB: 1960-09-03  08/24/2020  [name] was observed post Covid-19 immunization for 15 minutes without incident. She was provided with Vaccine Information Sheet and instruction to access the V-Safe system.   [name] was instructed to call 911 with any severe reactions post vaccine: Difficulty breathing  Swelling of face and throat  A fast heartbeat  A bad rash all over body  Dizziness and weakness   Immunizations Administered     Name Date Dose VIS Date Route   PFIZER Comrnaty([place] TOP) Covid-19 Vaccine 08/24/2020 12:05 PM 0.3 mL 02/09/2020 Intramuscular   Manufacturer: [place]   Lot: [id number]   [place]: [phone]

## 2020-08-26 MED ORDER — VENLAFAXINE HCL ER 150 MG PO CP24
ORAL_CAPSULE | Freq: Every day | ORAL | 3 refills | Status: AC
  Filled 2020-08-26: qty 90, 90d supply, fill #0
  Filled 2020-11-23: qty 90, 90d supply, fill #1
  Filled 2021-02-28: qty 90, 90d supply, fill #2

## 2020-08-27 ENCOUNTER — Other Ambulatory Visit: Payer: Self-pay

## 2020-09-19 ENCOUNTER — Other Ambulatory Visit: Payer: Self-pay

## 2020-09-19 MED ORDER — AMOXICILLIN 500 MG PO CAPS
2000.0000 mg | ORAL_CAPSULE | ORAL | 2 refills | Status: DC
  Filled 2020-09-19: qty 20, 5d supply, fill #0

## 2020-10-03 DIAGNOSIS — H524 Presbyopia: Secondary | ICD-10-CM | POA: Diagnosis not present

## 2020-11-02 ENCOUNTER — Other Ambulatory Visit: Payer: Self-pay

## 2020-11-02 DIAGNOSIS — D225 Melanocytic nevi of trunk: Secondary | ICD-10-CM | POA: Diagnosis not present

## 2020-11-02 DIAGNOSIS — L821 Other seborrheic keratosis: Secondary | ICD-10-CM | POA: Diagnosis not present

## 2020-11-02 DIAGNOSIS — D2272 Melanocytic nevi of left lower limb, including hip: Secondary | ICD-10-CM | POA: Diagnosis not present

## 2020-11-02 DIAGNOSIS — D2261 Melanocytic nevi of right upper limb, including shoulder: Secondary | ICD-10-CM | POA: Diagnosis not present

## 2020-11-02 DIAGNOSIS — D2271 Melanocytic nevi of right lower limb, including hip: Secondary | ICD-10-CM | POA: Diagnosis not present

## 2020-11-02 DIAGNOSIS — L02821 Furuncle of head [any part, except face]: Secondary | ICD-10-CM | POA: Diagnosis not present

## 2020-11-02 DIAGNOSIS — X32XXXA Exposure to sunlight, initial encounter: Secondary | ICD-10-CM | POA: Diagnosis not present

## 2020-11-02 DIAGNOSIS — L814 Other melanin hyperpigmentation: Secondary | ICD-10-CM | POA: Diagnosis not present

## 2020-11-02 DIAGNOSIS — D2262 Melanocytic nevi of left upper limb, including shoulder: Secondary | ICD-10-CM | POA: Diagnosis not present

## 2020-11-02 MED ORDER — CLINDAMYCIN PHOSPHATE 1 % EX SOLN
CUTANEOUS | 5 refills | Status: AC
  Filled 2020-11-02: qty 60, 30d supply, fill #0
  Filled 2021-02-28: qty 60, 30d supply, fill #1

## 2020-11-22 ENCOUNTER — Other Ambulatory Visit: Payer: Self-pay

## 2020-11-23 ENCOUNTER — Other Ambulatory Visit: Payer: Self-pay

## 2020-11-23 MED ORDER — FLECAINIDE ACETATE 100 MG PO TABS
ORAL_TABLET | Freq: Two times a day (BID) | ORAL | 3 refills | Status: AC
  Filled 2020-11-23: qty 180, 90d supply, fill #0
  Filled 2021-02-28: qty 180, 90d supply, fill #1

## 2020-11-23 MED FILL — Rosuvastatin Calcium Tab 10 MG: ORAL | 90 days supply | Qty: 90 | Fill #1 | Status: AC

## 2020-11-26 ENCOUNTER — Other Ambulatory Visit: Payer: Self-pay

## 2020-11-26 MED ORDER — BETAMETHASONE DIPROPIONATE 0.05 % EX CREA
TOPICAL_CREAM | Freq: Two times a day (BID) | CUTANEOUS | 5 refills | Status: AC
  Filled 2020-11-26: qty 30, 20d supply, fill #0
  Filled 2021-02-28: qty 30, 20d supply, fill #1

## 2020-12-06 ENCOUNTER — Ambulatory Visit: Payer: 59 | Admitting: Dermatology

## 2020-12-07 ENCOUNTER — Other Ambulatory Visit: Payer: Self-pay

## 2021-01-28 ENCOUNTER — Other Ambulatory Visit
Admission: RE | Admit: 2021-01-28 | Discharge: 2021-01-28 | Disposition: A | Discharge status: Home / Self Care | Payer: 59 | Attending: Internal Medicine | Admitting: Internal Medicine

## 2021-01-28 DIAGNOSIS — Z79899 Other long term (current) drug therapy: Secondary | ICD-10-CM | POA: Diagnosis not present

## 2021-01-28 DIAGNOSIS — R7309 Other abnormal glucose: Secondary | ICD-10-CM | POA: Diagnosis not present

## 2021-01-28 DIAGNOSIS — E7800 Pure hypercholesterolemia, unspecified: Secondary | ICD-10-CM | POA: Diagnosis not present

## 2021-01-28 LAB — COMPREHENSIVE METABOLIC PANEL
ALT: 19 U/L (ref 0–44)
AST: 25 U/L (ref 15–41)
Albumin: 4 g/dL (ref 3.5–5.0)
Alkaline Phosphatase: 74 U/L (ref 38–126)
Anion gap: 5 (ref 5–15)
BUN: 23 mg/dL (ref 8–23)
CO2: 28 mmol/L (ref 22–32)
Calcium: 9.4 mg/dL (ref 8.9–10.3)
Chloride: 104 mmol/L (ref 98–111)
Creatinine, Ser: 0.68 mg/dL (ref 0.44–1.00)
GFR, Estimated: >60 mL/min (ref >60)
Glucose, Bld: 142 mg/dL — ABNORMAL HIGH (ref 70–99)
Potassium: 4.1 mmol/L (ref 3.5–5.1)
Sodium: 137 mmol/L (ref 135–145)
Total Bilirubin: 0.7 mg/dL (ref 0.3–1.2)
Total Protein: 7.2 g/dL (ref 6.5–8.1)

## 2021-01-28 LAB — LIPID PANEL
Cholesterol: 248 mg/dL — ABNORMAL HIGH (ref 0–200)
HDL: 101 mg/dL (ref >40)
LDL Cholesterol: 119 mg/dL — ABNORMAL HIGH (ref 0–99)
Total CHOL/HDL Ratio: 2.5 RATIO
Triglycerides: 138 mg/dL (ref <150)
VLDL: 28 mg/dL (ref 0–40)

## 2021-01-28 LAB — URINALYSIS, COMPLETE (UACMP) WITH MICROSCOPIC
Bacteria, UA: NONE SEEN
Bilirubin Urine: NEGATIVE
Glucose, UA: NEGATIVE mg/dL
Leukocytes,Ua: NEGATIVE
Nitrite: NEGATIVE
Protein, ur: NEGATIVE mg/dL
Specific Gravity, Urine: >1.03 — ABNORMAL HIGH (ref 1.005–1.030)
Squamous Epithelial / HPF: NONE SEEN (ref 0–5)
pH: 5.5 (ref 5.0–8.0)

## 2021-01-28 LAB — TSH: TSH: 2.191 u[IU]/mL (ref 0.350–4.500)

## 2021-01-28 LAB — HEMOGLOBIN A1C
Hgb A1c MFr Bld: 5.4 % (ref 4.8–5.6)
Mean Plasma Glucose: 108 mg/dL

## 2021-01-29 ENCOUNTER — Other Ambulatory Visit: Payer: Self-pay

## 2021-01-29 DIAGNOSIS — I10 Essential (primary) hypertension: Secondary | ICD-10-CM | POA: Diagnosis not present

## 2021-01-29 DIAGNOSIS — I480 Paroxysmal atrial fibrillation: Secondary | ICD-10-CM | POA: Diagnosis not present

## 2021-01-29 DIAGNOSIS — E782 Mixed hyperlipidemia: Secondary | ICD-10-CM | POA: Diagnosis not present

## 2021-01-29 DIAGNOSIS — Z0000 Encounter for general adult medical examination without abnormal findings: Secondary | ICD-10-CM | POA: Diagnosis not present

## 2021-01-29 MED ORDER — CYCLOBENZAPRINE HCL 10 MG PO TABS
ORAL_TABLET | ORAL | 0 refills | Status: DC
  Filled 2021-01-29: qty 30, 30d supply, fill #0

## 2021-01-31 ENCOUNTER — Other Ambulatory Visit: Payer: Self-pay

## 2021-01-31 ENCOUNTER — Ambulatory Visit: Payer: 59 | Attending: Internal Medicine

## 2021-01-31 DIAGNOSIS — Z01419 Encounter for gynecological examination (general) (routine) without abnormal findings: Secondary | ICD-10-CM | POA: Diagnosis not present

## 2021-01-31 DIAGNOSIS — N898 Other specified noninflammatory disorders of vagina: Secondary | ICD-10-CM | POA: Diagnosis not present

## 2021-01-31 DIAGNOSIS — Z124 Encounter for screening for malignant neoplasm of cervix: Secondary | ICD-10-CM | POA: Diagnosis not present

## 2021-01-31 DIAGNOSIS — N760 Acute vaginitis: Secondary | ICD-10-CM | POA: Diagnosis not present

## 2021-01-31 DIAGNOSIS — B9689 Other specified bacterial agents as the cause of diseases classified elsewhere: Secondary | ICD-10-CM | POA: Diagnosis not present

## 2021-01-31 DIAGNOSIS — R1900 Intra-abdominal and pelvic swelling, mass and lump, unspecified site: Secondary | ICD-10-CM | POA: Diagnosis not present

## 2021-01-31 DIAGNOSIS — Z23 Encounter for immunization: Secondary | ICD-10-CM

## 2021-01-31 MED ORDER — METRONIDAZOLE 0.75 % VA GEL
VAGINAL | 0 refills | Status: DC
Start: 2021-01-31 — End: 2022-01-20
  Filled 2021-01-31: qty 70, 5d supply, fill #0

## 2021-01-31 MED ORDER — PFIZER COVID-19 VAC BIVALENT 30 MCG/0.3ML IM SUSP
INTRAMUSCULAR | 0 refills | Status: AC
  Filled 2021-01-31: qty 0.3, 1d supply, fill #0

## 2021-01-31 NOTE — Progress Notes (Signed)
   Covid-19 Vaccination Clinic  Name:  Autumnrose Yore    MRN: [id number] DOB: 1961-02-28  01/31/2021  Ms. Lenis was observed post Covid-19 immunization for 15 minutes without incident. She was provided with Vaccine Information Sheet and instruction to access the V-Safe system.   Ms. Brimley was instructed to call 911 with any severe reactions post vaccine: Difficulty breathing  Swelling of face and throat  A fast heartbeat  A bad rash all over body  Dizziness and weakness   Immunizations Administered     Name Date Dose VIS Date Route   Pfizer Covid-19 Vaccine Bivalent Booster 01/31/2021  9:32 AM 0.3 mL 10/31/2020 Intramuscular   Manufacturer: [place]   Lot: [id number]   [place]: [phone]       Covid-19 Vaccination Clinic  Name:  Ivyana Locey    MRN: [id number] DOB: 09-Sep-1960  01/31/2021  Ms. Suchecki was observed post Covid-19 immunization for 15 minutes without incident. She was provided with Vaccine Information Sheet and instruction to access the V-Safe system.   Ms. Mccurley was instructed to call 911 with any severe reactions post vaccine: Difficulty breathing  Swelling of face and throat  A fast heartbeat  A bad rash all over body  Dizziness and weakness   Immunizations Administered     Name Date Dose VIS Date Route   Pfizer Covid-19 Vaccine Bivalent Booster 01/31/2021  9:32 AM 0.3 mL 10/31/2020 Intramuscular   Manufacturer: [place]   Lot: [id number]   [place]: [phone]

## 2021-02-01 ENCOUNTER — Other Ambulatory Visit: Payer: Self-pay

## 2021-02-28 ENCOUNTER — Other Ambulatory Visit: Payer: Self-pay

## 2021-02-28 MED ORDER — ROSUVASTATIN CALCIUM 10 MG PO TABS
ORAL_TABLET | Freq: Every day | ORAL | 3 refills | Status: AC
Start: 2021-02-28 — End: 2024-01-25
  Filled 2021-02-28: qty 90, 90d supply, fill #0

## 2021-03-01 ENCOUNTER — Other Ambulatory Visit: Payer: Self-pay

## 2021-07-04 DIAGNOSIS — M7050 Other bursitis of knee, unspecified knee: Secondary | ICD-10-CM | POA: Diagnosis not present

## 2021-07-04 DIAGNOSIS — Z96653 Presence of artificial knee joint, bilateral: Secondary | ICD-10-CM | POA: Diagnosis not present

## 2021-07-15 ENCOUNTER — Other Ambulatory Visit: Payer: Self-pay | Admitting: Internal Medicine

## 2021-07-15 ENCOUNTER — Other Ambulatory Visit: Payer: Self-pay | Admitting: Obstetrics and Gynecology

## 2021-07-15 DIAGNOSIS — Z1231 Encounter for screening mammogram for malignant neoplasm of breast: Secondary | ICD-10-CM

## 2021-07-23 DIAGNOSIS — R7309 Other abnormal glucose: Secondary | ICD-10-CM | POA: Diagnosis not present

## 2021-07-23 DIAGNOSIS — I10 Essential (primary) hypertension: Secondary | ICD-10-CM | POA: Diagnosis not present

## 2021-07-23 DIAGNOSIS — M8588 Other specified disorders of bone density and structure, other site: Secondary | ICD-10-CM | POA: Diagnosis not present

## 2021-07-23 DIAGNOSIS — E782 Mixed hyperlipidemia: Secondary | ICD-10-CM | POA: Diagnosis not present

## 2021-07-23 DIAGNOSIS — Z79899 Other long term (current) drug therapy: Secondary | ICD-10-CM | POA: Diagnosis not present

## 2021-07-30 DIAGNOSIS — Z9884 Bariatric surgery status: Secondary | ICD-10-CM | POA: Diagnosis not present

## 2021-07-30 DIAGNOSIS — R7309 Other abnormal glucose: Secondary | ICD-10-CM | POA: Diagnosis not present

## 2021-07-30 DIAGNOSIS — E7800 Pure hypercholesterolemia, unspecified: Secondary | ICD-10-CM | POA: Diagnosis not present

## 2021-07-30 DIAGNOSIS — I10 Essential (primary) hypertension: Secondary | ICD-10-CM | POA: Diagnosis not present

## 2021-07-30 DIAGNOSIS — I480 Paroxysmal atrial fibrillation: Secondary | ICD-10-CM | POA: Diagnosis not present

## 2021-07-30 DIAGNOSIS — M2550 Pain in unspecified joint: Secondary | ICD-10-CM | POA: Diagnosis not present

## 2021-08-08 DIAGNOSIS — I10 Essential (primary) hypertension: Secondary | ICD-10-CM | POA: Diagnosis not present

## 2021-08-08 DIAGNOSIS — E6609 Other obesity due to excess calories: Secondary | ICD-10-CM | POA: Diagnosis not present

## 2021-08-08 DIAGNOSIS — Z9884 Bariatric surgery status: Secondary | ICD-10-CM | POA: Diagnosis not present

## 2021-08-08 DIAGNOSIS — R7303 Prediabetes: Secondary | ICD-10-CM | POA: Diagnosis not present

## 2021-08-14 ENCOUNTER — Ambulatory Visit
Admission: RE | Admit: 2021-08-14 | Discharge: 2021-08-14 | Disposition: A | Discharge status: Home / Self Care | Payer: 59 | Source: Ambulatory Visit | Attending: Obstetrics and Gynecology | Admitting: Obstetrics and Gynecology

## 2021-08-14 DIAGNOSIS — Z1231 Encounter for screening mammogram for malignant neoplasm of breast: Secondary | ICD-10-CM | POA: Insufficient documentation

## 2021-08-22 DIAGNOSIS — M1812 Unilateral primary osteoarthritis of first carpometacarpal joint, left hand: Secondary | ICD-10-CM | POA: Diagnosis not present

## 2021-08-22 DIAGNOSIS — M1811 Unilateral primary osteoarthritis of first carpometacarpal joint, right hand: Secondary | ICD-10-CM | POA: Diagnosis not present

## 2022-01-06 DIAGNOSIS — I480 Paroxysmal atrial fibrillation: Secondary | ICD-10-CM | POA: Diagnosis not present

## 2022-01-06 DIAGNOSIS — R053 Chronic cough: Secondary | ICD-10-CM | POA: Diagnosis not present

## 2022-01-06 DIAGNOSIS — E785 Hyperlipidemia, unspecified: Secondary | ICD-10-CM | POA: Diagnosis not present

## 2022-01-06 DIAGNOSIS — R7309 Other abnormal glucose: Secondary | ICD-10-CM | POA: Diagnosis not present

## 2022-01-06 DIAGNOSIS — R059 Cough, unspecified: Secondary | ICD-10-CM | POA: Diagnosis not present

## 2022-01-06 DIAGNOSIS — Z79899 Other long term (current) drug therapy: Secondary | ICD-10-CM | POA: Diagnosis not present

## 2022-01-06 DIAGNOSIS — I10 Essential (primary) hypertension: Secondary | ICD-10-CM | POA: Diagnosis not present

## 2022-01-13 DIAGNOSIS — M65332 Trigger finger, left middle finger: Secondary | ICD-10-CM | POA: Diagnosis not present

## 2022-01-13 NOTE — Progress Notes (Signed)
PCP:  Idelle Crouch, MD Primary Cardiologist: Cristopher Peru, MD Electrophysiologist: Cristopher Peru, MD   Brianna French is a 61 y.o. female seen today for Cristopher Peru, MD for routine electrophysiology followup. Since last being seen in our clinic the patient reports doing well overall. She did have more palpitations consistent with her fib/flutter this summer. She spends 9+ months of the year living in the [place], and only has [employer] in the bedroom. She noticed the heat was causing her to have more AF, but no longer than 20-30 minutes at a time. Works on staying hydrated. she denies chest pain, dyspnea, PND, orthopnea, nausea, vomiting, dizziness, syncope, edema, weight gain, or early satiety.   Past Medical History:  Diagnosis Date   Arrhythmia    Arthritis    Atrial fibrillation (HCC)    Chest pain    NM Myocardial Scan 08/31/12    Chickenpox    Daytime somnolence    Depression    Dysrhythmia    Hyperlipemia    Mixed hyperlipidemia    Ovarian cyst    Palpitations    Plantar fasciitis of left foot    PSVT (paroxysmal supraventricular tachycardia)    Sleep apnea    on CPAP   Past Surgical History:  Procedure Laterality Date   APPENDECTOMY  07-16-14   Dr. Marina Gravel   BILATERAL KNEE ARTHROSCOPY  05/06/10   COLONOSCOPY WITH PROPOFOL N/A 02/09/2019   Procedure: COLONOSCOPY WITH PROPOFOL;  Surgeon: Jonathon Bellows, MD;  Location: [facility] ENDOSCOPY;  Service: Gastroenterology;  Laterality: N/A;   HYSTEROSCOPY  12/2017   in office   KNEE ARTHROSCOPY Right 2007   LAPAROSCOPIC APPENDECTOMY N/A 07/16/2014   Procedure: APPENDECTOMY LAPAROSCOPIC;  Surgeon: Sherri Rad, MD;  Location: [facility] ORS;  Service: General;  Laterality: N/A;   LAPAROSCOPIC GASTRIC SLEEVE RESECTION WITH HIATAL HERNIA REPAIR  04/19/2018   Procedure: LAPAROSCOPIC GASTRIC SLEEVE RESECTION WITH HIATAL HERNIA REPAIR;  Surgeon: Greer Pickerel, MD;  Location: [facility] ORS;  Service: General;;   OVARIAN CYST SURGERY  1980    REPLACEMENT TOTAL KNEE BILATERAL Bilateral    TONSILLECTOMY     age 20   TRIGGER FINGER RELEASE Right 06/06/11   Right ring finger   UPPER GI ENDOSCOPY  04/19/2018   Procedure: UPPER GI ENDOSCOPY;  Surgeon: Greer Pickerel, MD;  Location: [facility] ORS;  Service: General;;    Current Outpatient Medications  Medication Sig Dispense Refill   amoxicillin (AMOXIL) 500 MG capsule Take 4 capsules (2,000 mg total) by mouth 1 hour prior to dental appointment 20 capsule 2   aspirin EC 81 MG tablet Take 81 mg by mouth daily.     betamethasone dipropionate 0.05 % cream Apply topically 2 (two) times daily 30 g 5   Biotin 10000 MCG TABS Take 1 capsule by mouth daily.     Calcium Citrate-Vitamin D (CELEBRATE CALCIUM PLUS 500 PO) Take 500 mg by mouth daily.     clindamycin (CLEOCIN T) 1 % external solution Apply to scalp twice daily as needed for new lesions. 60 mL 5   clotrimazole-betamethasone (LOTRISONE) cream Apply 1 application topically daily as needed (rash). APPLY A SMALL AMOUNT TO AFFECTED AREA 2 TIMES A DAY AS DIRECTED     COVID-19 mRNA bivalent vaccine, Pfizer, (PFIZER COVID-19 VAC BIVALENT) injection Inject into the muscle. 0.3 mL 0   COVID-19 mRNA Vac-TriS, Pfizer, (PFIZER-BIONT COVID-19 VAC-TRIS) SUSP injection Inject into the muscle. 0.3 mL 0   cyanocobalamin (VITAMIN B12) 1000 MCG tablet 1,000 mcg daily.  Magnesium Oxide (MAG-OXIDE PO) Take 800 mg by mouth every morning.      rosuvastatin (CRESTOR) 10 MG tablet TAKE ONE TABLET BY MOUTH AT BEDTIME 90 tablet 3   flecainide (TAMBOCOR) 100 MG tablet TAKE 1 TABLET BY MOUTH 2 TIMES DAILY 180 tablet 3   venlafaxine XR (EFFEXOR-XR) 150 MG 24 hr capsule Take by mouth daily. 90 capsule 3   No current facility-administered medications for this visit.    Allergies  Allergen Reactions   Ivp Dye [Iodinated Contrast Media] Other (See Comments)    ALSO VICRYL CAUSES OPEN SORES/BLISTERS   Other Other (See Comments)    Vicryl suture causes sores/blisters,  narcotics make her sick   Pneumococcal Vac Polyvalent Other (See Comments)    Fever, edema   Pneumococcal Vaccine Other (See Comments)    Fever, edema    Social History   Socioeconomic History   Marital status: Married    Spouse name: Not on file   Number of children: Not on file   Years of education: Not on file   Highest education level: Not on file  Occupational History   Not on file  Tobacco Use   Smoking status: Former    Types: Cigarettes    Quit date: 03/04/1979    Years since quitting: 42.9   Smokeless tobacco: Never  Vaping Use   Vaping Use: Never used  Substance and Sexual Activity   Alcohol use: Not Currently    Alcohol/week: 0.0 standard drinks of alcohol    Comment: occasional sip of wine   Drug use: Never   Sexual activity: Yes  Other Topics Concern   Not on file  Social History Narrative   Not on file   Social Determinants of Health   Financial Resource Strain: Not on file  Food Insecurity: Not on file  Transportation Needs: Not on file  Physical Activity: Not on file  Stress: Not on file  Social Connections: Not on file  Intimate Partner Violence: Not on file   Review of Systems: All other systems reviewed and are otherwise negative except as noted above.  Physical Exam: Vitals:   01/20/22 0920  BP: 122/66  Pulse: 69  SpO2: 99%  Weight: 209 lb (94.8 kg)  Height: '5\' 4"'$  (1.626 m)    GEN- The patient is well appearing, alert and oriented x 3 today.   HEENT: normocephalic, atraumatic; sclera clear, conjunctiva pink; hearing intact; oropharynx clear; neck supple, no JVP Lymph- no cervical lymphadenopathy Lungs- Clear to ausculation bilaterally, normal work of breathing.  No wheezes, rales, rhonchi Heart- Regular rate and rhythm, no murmurs, rubs or gallops, PMI not laterally displaced GI- soft, non-tender, non-distended, bowel sounds present, no hepatosplenomegaly Extremities- No peripheral edema. no clubbing or cyanosis; DP/PT/radial pulses  2+ bilaterally MS- no significant deformity or atrophy Skin- warm and dry, no rash or lesion Psych- euthymic mood, full affect Neuro- strength and sensation are intact  EKG is ordered. Personal review of EKG from today shows NSR at 69 bpm, stable interval scompared to prior, 06/2020  Additional studies reviewed include: Previous EP notes.   Assessment and Plan:  1. PAF EKG today shows NSR with stable intervals Continue flecainide 100 mg BID CHA2DS2/VASc is at least 2 (She denies h/o of HTN, so would be 1) She wishes to remain off [place] now. She does have paroxysms but nothing longer than 20-30 minutes Discussed ablation and other AAD options at length.  She wishes to continue current therapy for now. Tikosyn could potentially  work for her, though she we have to fill large supplies at a time, and need to work her schedule around [place]   2. Obesity Body mass index is 35.87 kg/m.  Encouraged lifestyle modification   3. OSA  Encouraged nightly CPAP   Follow up with Dr. Lovena Le in 12 months  Shirley Friar, PA-C  01/20/22 9:31 AM

## 2022-01-16 ENCOUNTER — Ambulatory Visit: Payer: 59 | Admitting: Internal Medicine

## 2022-01-20 ENCOUNTER — Encounter: Payer: Self-pay | Admitting: Student

## 2022-01-20 ENCOUNTER — Ambulatory Visit: Payer: 59 | Attending: Internal Medicine | Admitting: Student

## 2022-01-20 VITALS — BP 122/66 | HR 69 | Ht 64.0 in | Wt 209.0 lb

## 2022-01-20 DIAGNOSIS — E669 Obesity, unspecified: Secondary | ICD-10-CM

## 2022-01-20 DIAGNOSIS — I480 Paroxysmal atrial fibrillation: Secondary | ICD-10-CM | POA: Diagnosis not present

## 2022-01-20 DIAGNOSIS — G4733 Obstructive sleep apnea (adult) (pediatric): Secondary | ICD-10-CM

## 2022-01-20 NOTE — Patient Instructions (Signed)
Medication Instructions:  Your physician recommends that you continue on your current medications as directed. Please refer to the Current Medication list given to you today.  *If you need a refill on your cardiac medications before your next appointment, please call your pharmacy*   Lab Work: None  If you have labs (blood work) drawn today and your tests are completely normal, you will receive your results only by: [place] (if you have MyChart) OR A paper copy in the mail If you have any lab test that is abnormal or we need to change your treatment, we will call you to review the results.   Follow-Up: At [facility], you and your health needs are our priority.  As part of our continuing mission to provide you with exceptional heart care, we have created designated Provider Care Teams.  These Care Teams include your primary Cardiologist (physician) and Advanced Practice Providers (APPs -  Physician Assistants and Nurse Practitioners) who all work together to provide you with the care you need, when you need it.  We recommend signing up for the patient portal called "MyChart".  Sign up information is provided on this After Visit Summary.  MyChart is used to connect with patients for Virtual Visits (Telemedicine).  Patients are able to view lab/test results, encounter notes, upcoming appointments, etc.  Non-urgent messages can be sent to your provider as well.   To learn more about what you can do with MyChart, go to [email or web address].    Your next appointment:   1 year(s)  The format for your next appointment:   In Person  Provider:   Cristopher Peru, MD     Important Information About Sugar

## 2022-01-29 DIAGNOSIS — M65331 Trigger finger, right middle finger: Secondary | ICD-10-CM | POA: Diagnosis not present

## 2022-01-29 DIAGNOSIS — M65311 Trigger thumb, right thumb: Secondary | ICD-10-CM | POA: Diagnosis not present

## 2022-02-05 DIAGNOSIS — D2271 Melanocytic nevi of right lower limb, including hip: Secondary | ICD-10-CM | POA: Diagnosis not present

## 2022-02-05 DIAGNOSIS — L820 Inflamed seborrheic keratosis: Secondary | ICD-10-CM | POA: Diagnosis not present

## 2022-02-05 DIAGNOSIS — L02821 Furuncle of head [any part, except face]: Secondary | ICD-10-CM | POA: Diagnosis not present

## 2022-02-05 DIAGNOSIS — L403 Pustulosis palmaris et plantaris: Secondary | ICD-10-CM | POA: Diagnosis not present

## 2022-02-05 DIAGNOSIS — D2262 Melanocytic nevi of left upper limb, including shoulder: Secondary | ICD-10-CM | POA: Diagnosis not present

## 2022-02-05 DIAGNOSIS — L710 Perioral dermatitis: Secondary | ICD-10-CM | POA: Diagnosis not present

## 2022-02-05 DIAGNOSIS — L304 Erythema intertrigo: Secondary | ICD-10-CM | POA: Diagnosis not present

## 2022-02-05 DIAGNOSIS — D2261 Melanocytic nevi of right upper limb, including shoulder: Secondary | ICD-10-CM | POA: Diagnosis not present

## 2022-02-05 DIAGNOSIS — D225 Melanocytic nevi of trunk: Secondary | ICD-10-CM | POA: Diagnosis not present

## 2022-07-16 ENCOUNTER — Other Ambulatory Visit: Payer: Self-pay | Admitting: Internal Medicine

## 2022-07-16 DIAGNOSIS — Z1231 Encounter for screening mammogram for malignant neoplasm of breast: Secondary | ICD-10-CM

## 2022-08-04 DIAGNOSIS — Z79899 Other long term (current) drug therapy: Secondary | ICD-10-CM | POA: Diagnosis not present

## 2022-08-04 DIAGNOSIS — E7800 Pure hypercholesterolemia, unspecified: Secondary | ICD-10-CM | POA: Diagnosis not present

## 2022-08-04 DIAGNOSIS — R7309 Other abnormal glucose: Secondary | ICD-10-CM | POA: Diagnosis not present

## 2022-08-05 DIAGNOSIS — Z0000 Encounter for general adult medical examination without abnormal findings: Secondary | ICD-10-CM | POA: Diagnosis not present

## 2022-08-05 DIAGNOSIS — M25521 Pain in right elbow: Secondary | ICD-10-CM | POA: Diagnosis not present

## 2022-08-05 DIAGNOSIS — Z9884 Bariatric surgery status: Secondary | ICD-10-CM | POA: Diagnosis not present

## 2022-08-05 DIAGNOSIS — E785 Hyperlipidemia, unspecified: Secondary | ICD-10-CM | POA: Diagnosis not present

## 2022-08-05 DIAGNOSIS — R7309 Other abnormal glucose: Secondary | ICD-10-CM | POA: Diagnosis not present

## 2022-08-05 DIAGNOSIS — I10 Essential (primary) hypertension: Secondary | ICD-10-CM | POA: Diagnosis not present

## 2022-08-05 DIAGNOSIS — M5030 Other cervical disc degeneration, unspecified cervical region: Secondary | ICD-10-CM | POA: Diagnosis not present

## 2022-08-05 DIAGNOSIS — I480 Paroxysmal atrial fibrillation: Secondary | ICD-10-CM | POA: Diagnosis not present

## 2022-08-05 DIAGNOSIS — Z6832 Body mass index (BMI) 32.0-32.9, adult: Secondary | ICD-10-CM | POA: Diagnosis not present

## 2022-08-05 DIAGNOSIS — E6609 Other obesity due to excess calories: Secondary | ICD-10-CM | POA: Diagnosis not present

## 2022-08-21 ENCOUNTER — Ambulatory Visit
Admission: RE | Admit: 2022-08-21 | Discharge: 2022-08-21 | Disposition: A | Discharge status: Home / Self Care | Payer: 59 | Source: Ambulatory Visit | Attending: Internal Medicine | Admitting: Internal Medicine

## 2022-08-21 DIAGNOSIS — Z1231 Encounter for screening mammogram for malignant neoplasm of breast: Secondary | ICD-10-CM | POA: Insufficient documentation

## 2022-08-26 DIAGNOSIS — R87610 Atypical squamous cells of undetermined significance on cytologic smear of cervix (ASC-US): Secondary | ICD-10-CM | POA: Diagnosis not present

## 2022-08-26 DIAGNOSIS — Z124 Encounter for screening for malignant neoplasm of cervix: Secondary | ICD-10-CM | POA: Diagnosis not present

## 2022-08-26 DIAGNOSIS — Z01419 Encounter for gynecological examination (general) (routine) without abnormal findings: Secondary | ICD-10-CM | POA: Diagnosis not present

## 2022-08-26 DIAGNOSIS — Z1331 Encounter for screening for depression: Secondary | ICD-10-CM | POA: Diagnosis not present

## 2022-11-06 ENCOUNTER — Encounter (HOSPITAL_COMMUNITY): Payer: Self-pay | Admitting: *Deleted

## 2023-07-16 DIAGNOSIS — R399 Unspecified symptoms and signs involving the genitourinary system: Secondary | ICD-10-CM | POA: Diagnosis not present

## 2023-07-21 DIAGNOSIS — Z79899 Other long term (current) drug therapy: Secondary | ICD-10-CM | POA: Diagnosis not present

## 2023-07-21 DIAGNOSIS — R8290 Unspecified abnormal findings in urine: Secondary | ICD-10-CM | POA: Diagnosis not present

## 2023-07-21 DIAGNOSIS — R519 Headache, unspecified: Secondary | ICD-10-CM | POA: Diagnosis not present

## 2023-07-21 DIAGNOSIS — I10 Essential (primary) hypertension: Secondary | ICD-10-CM | POA: Diagnosis not present

## 2023-07-21 DIAGNOSIS — E6609 Other obesity due to excess calories: Secondary | ICD-10-CM | POA: Diagnosis not present

## 2023-07-21 DIAGNOSIS — G8929 Other chronic pain: Secondary | ICD-10-CM | POA: Diagnosis not present

## 2023-07-21 DIAGNOSIS — M25551 Pain in right hip: Secondary | ICD-10-CM | POA: Diagnosis not present

## 2023-07-21 DIAGNOSIS — R7309 Other abnormal glucose: Secondary | ICD-10-CM | POA: Diagnosis not present

## 2023-07-21 DIAGNOSIS — E66811 Obesity, class 1: Secondary | ICD-10-CM | POA: Diagnosis not present

## 2023-07-21 DIAGNOSIS — R7303 Prediabetes: Secondary | ICD-10-CM | POA: Diagnosis not present

## 2023-07-21 DIAGNOSIS — E785 Hyperlipidemia, unspecified: Secondary | ICD-10-CM | POA: Diagnosis not present

## 2023-07-21 DIAGNOSIS — M5431 Sciatica, right side: Secondary | ICD-10-CM | POA: Diagnosis not present

## 2023-07-22 DIAGNOSIS — R8290 Unspecified abnormal findings in urine: Secondary | ICD-10-CM | POA: Diagnosis not present

## 2023-07-24 ENCOUNTER — Other Ambulatory Visit: Payer: Self-pay | Admitting: Internal Medicine

## 2023-07-24 DIAGNOSIS — R519 Headache, unspecified: Secondary | ICD-10-CM

## 2023-10-23 ENCOUNTER — Other Ambulatory Visit: Payer: Self-pay | Admitting: Internal Medicine

## 2023-10-23 DIAGNOSIS — Z1231 Encounter for screening mammogram for malignant neoplasm of breast: Secondary | ICD-10-CM

## 2023-11-11 ENCOUNTER — Encounter (HOSPITAL_COMMUNITY): Payer: Self-pay | Admitting: *Deleted

## 2024-01-07 ENCOUNTER — Ambulatory Visit
Admission: RE | Admit: 2024-01-07 | Discharge: 2024-01-07 | Disposition: A | Discharge status: Home / Self Care | Source: Ambulatory Visit | Attending: Internal Medicine | Admitting: Internal Medicine

## 2024-01-07 DIAGNOSIS — Z1231 Encounter for screening mammogram for malignant neoplasm of breast: Secondary | ICD-10-CM | POA: Diagnosis present

## 2024-01-25 ENCOUNTER — Encounter: Payer: Self-pay | Admitting: Internal Medicine

## 2024-01-25 ENCOUNTER — Ambulatory Visit: Attending: Internal Medicine | Admitting: Internal Medicine

## 2024-01-25 VITALS — BP 136/84 | HR 69 | Ht 64.0 in | Wt 213.0 lb

## 2024-01-25 DIAGNOSIS — I480 Paroxysmal atrial fibrillation: Secondary | ICD-10-CM | POA: Insufficient documentation

## 2024-01-25 DIAGNOSIS — R0609 Other forms of dyspnea: Secondary | ICD-10-CM | POA: Insufficient documentation

## 2024-01-25 MED ORDER — APIXABAN 5 MG PO TABS: 5.0000 mg | ORAL_TABLET | Freq: Two times a day (BID) | ORAL | 3 refills | Status: DC

## 2024-01-25 NOTE — Progress Notes (Signed)
 HPI Brianna French returns today for followup. I have not seen her in over 3 years. She is a pleasant previously morbidly obese woman with PAF, who has undergone bariatric surgery and lost 90 lbs. But over the past 3 years has gained back 45 lbs. She feels well. [place]. She denies chest pain or sob. She notes occasional palpitations on flecainide . She thinks her atrial fib is a little worse. Her main problem today is exertional dyspnea and chest pressure. Her exercise ability has decreased though it is in the setting of significant weight gain.  Allergies  Allergen Reactions   Ivp Dye [Iodinated Contrast Media] Other (See Comments)    ALSO VICRYL CAUSES OPEN SORES/BLISTERS   Other Other (See Comments)    Vicryl suture causes sores/blisters, narcotics make her sick   Pneumococcal Vac Polyvalent Other (See Comments)    Fever, edema   Pneumococcal Vaccine Other (See Comments)    Fever, edema     Current Outpatient Medications  Medication Sig Dispense Refill   Ascorbic Acid (VITAMIN C) 1000 MG tablet Take 1,000 mg by mouth daily.     aspirin  EC 81 MG tablet Take 81 mg by mouth daily.     betamethasone  dipropionate 0.05 % cream Apply topically 2 (two) times daily 30 g 5   Calcium  Citrate-Vitamin D  (CELEBRATE CALCIUM  PLUS 500 PO) Take 500 mg by mouth daily.     clindamycin  (CLEOCIN  T) 1 % external solution Apply to scalp twice daily as needed for new lesions. 60 mL 5   cloNIDine (CATAPRES) 0.1 MG tablet Take 0.1 mg by mouth 2 (two) times daily.     clotrimazole -betamethasone  (LOTRISONE ) cream Apply 1 application topically daily as needed (rash). APPLY A SMALL AMOUNT TO AFFECTED AREA 2 TIMES A DAY AS DIRECTED     COVID-19 mRNA bivalent vaccine, Pfizer, (PFIZER COVID-19 VAC BIVALENT) injection Inject into the muscle. 0.3 mL 0   COVID-19 mRNA Vac-TriS, Pfizer, (PFIZER-BIONT COVID-19 VAC-TRIS) SUSP injection Inject into the muscle. 0.3 mL 0    cyanocobalamin  (VITAMIN B12) 1000 MCG tablet 1,000 mcg daily.     flecainide  (TAMBOCOR ) 100 MG tablet TAKE 1 TABLET BY MOUTH 2 TIMES DAILY 180 tablet 3   Magnesium Oxide (MAG-OXIDE PO) Take 800 mg by mouth every morning.      rosuvastatin  (CRESTOR ) 10 MG tablet TAKE ONE TABLET BY MOUTH AT BEDTIME 90 tablet 3   venlafaxine  XR (EFFEXOR -XR) 150 MG 24 hr capsule Take by mouth daily. 90 capsule 3   No current facility-administered medications for this visit.     Past Medical History:  Diagnosis Date   Arrhythmia    Arthritis    Atrial fibrillation (HCC)    Chest pain    NM Myocardial Scan 08/31/12    Chickenpox    Daytime somnolence    Depression    Dysrhythmia    Hyperlipemia    Mixed hyperlipidemia    Ovarian cyst    Palpitations    Plantar fasciitis of left foot    PSVT (paroxysmal supraventricular tachycardia)    Sleep apnea    on CPAP    ROS:   All systems reviewed and negative except as noted in the HPI.   Past Surgical History:  Procedure Laterality Date   APPENDECTOMY  07-16-14   Dr. Eluterio   BILATERAL KNEE ARTHROSCOPY  05/06/10   COLONOSCOPY WITH PROPOFOL  N/A 02/09/2019   Procedure: COLONOSCOPY WITH PROPOFOL ;  Surgeon: Therisa,  Ruel, MD;  Location: [facility] ENDOSCOPY;  Service: Gastroenterology;  Laterality: N/A;   HYSTEROSCOPY  12/2017   in office   KNEE ARTHROSCOPY Right 2007   LAPAROSCOPIC APPENDECTOMY N/A 07/16/2014   Procedure: APPENDECTOMY LAPAROSCOPIC;  Surgeon: Oneil Chang, MD;  Location: [facility] ORS;  Service: General;  Laterality: N/A;   LAPAROSCOPIC GASTRIC SLEEVE RESECTION WITH HIATAL HERNIA REPAIR  04/19/2018   Procedure: LAPAROSCOPIC GASTRIC SLEEVE RESECTION WITH HIATAL HERNIA REPAIR;  Surgeon: Tanda Locus, MD;  Location: [facility] ORS;  Service: General;;   OVARIAN CYST SURGERY  1980   REPLACEMENT TOTAL KNEE BILATERAL Bilateral    TONSILLECTOMY     age 63   TRIGGER FINGER RELEASE Right 06/06/11   Right ring finger   UPPER GI ENDOSCOPY  04/19/2018   Procedure: UPPER GI  ENDOSCOPY;  Surgeon: Tanda Locus, MD;  Location: [facility] ORS;  Service: General;;     Family History  Problem Relation Age of Onset   Heart attack Father    Hypertension Mother    Diabetes type II Mother    Asthma Mother    Mental illness Mother    Hypertension Sister    Diabetes type II Sister    Diabetes type II Other    Cancer Other    CAD Other        Fam Hx of CAD   Breast cancer Neg Hx      Social History   Socioeconomic History   Marital status: Married    Spouse name: Not on file   Number of children: Not on file   Years of education: Not on file   Highest education level: Not on file  Occupational History   Not on file  Tobacco Use   Smoking status: Former    Current packs/day: 0.00    Types: Cigarettes    Quit date: 03/04/1979    Years since quitting: 44.9   Smokeless tobacco: Never  Vaping Use   Vaping status: Never Used  Substance and Sexual Activity   Alcohol use: Not Currently    Alcohol/week: 0.0 standard drinks of alcohol    Comment: occasional sip of wine   Drug use: Never   Sexual activity: Yes  Other Topics Concern   Not on file  Social History Narrative   Not on file   Social Drivers of Health   Financial Resource Strain: Low Risk  (01/22/2024)   [facility]   Overall Financial Resource Strain (CARDIA)    Difficulty of Paying Living Expenses: Not hard at all  Food Insecurity: No Food Insecurity (01/22/2024)   Received from [facility]   Hunger Vital Sign    Within the past 12 months, you worried that your food would run out before you got the money to buy more.: Never true    Within the past 12 months, the food you bought just didn't last and you didn't have money to get more.: Never true  Transportation Needs: No Transportation Needs (01/22/2024)   Received from [facility] - Transportation    In the past 12 months, has lack of transportation kept you from  medical appointments or from getting medications?: No    Lack of Transportation (Non-Medical): No  Physical Activity: Not on file  Stress: Not on file  Social Connections: Not on file  Intimate Partner Violence: Not on file     BP 136/84   Pulse 69   Ht 5' 4 (1.626 m)  Wt 213 lb (96.6 kg)   SpO2 98%   BMI 36.56 kg/m   Physical Exam:  Obese appearing,  NAD HEENT: Unremarkable Neck:  No JVD, no thyromegally Lymphatics:  No adenopathy Back:  No CVA tenderness Lungs:  Clear with no wheezes HEART:  Regular rate rhythm, no murmurs, no rubs, no clicks Abd:  soft, positive bowel sounds, no organomegally, no rebound, no guarding Ext:  2 plus pulses, no edema, no cyanosis, no clubbing Skin:  No rashes no nodules Neuro:  CN II through XII intact, motor grossly intact  EKG - nsr  Assess/Plan:   PAF - she has had a little uptick in her symptoms. I encouraged her to lose weight.  She will be referred to Dr. Almetta to discuss PVI. 2. Obesity - she has gained 45 lbs and I have encouraged her to lose the weight. She is frustrated by her inability to obtain glp-1's.  3. Sleep apnea - she had been on CPAP when she was much heavier. I encouraged her to be rechecked if her snoring were to worsen or she gained more weight. 4. Exertional dyspnea - she has a strong family history of CAD. She will undergo stress PET.    Danelle Merwyn Hodapp,MD

## 2024-01-25 NOTE — Patient Instructions (Addendum)
 Medication Instructions:  Your physician has recommended you make the following change in your medication:  Start Eliquis  5mg  twice daily  Lab Work: None ordered.  You may go to any [facility] Location for your lab work:  [facility] - [address] ([facility]) - [phone] [address]  [place] - [address]   [place]  - [address]   [place] - [address] - [address] [employer]  - [address] [place] - [address] ([facility]   If you have labs (blood work) drawn today and your tests are completely normal, you will receive your results only by: [employer] (if you have MyChart)  If you have any lab test that is abnormal or we need to change your treatment, we will call you or send a MyChart message to review the results.  Testing/Procedures: Your physician has requested that you have a lexiscan myoview.   Follow-Up: At [facility], you and your health needs are our priority.  As part of our continuing mission to provide you with exceptional heart care, we have created designated Provider Care Teams.  These Care Teams include your primary Cardiologist (physician) and Advanced Practice Providers (APPs -  Physician Assistants and Nurse Practitioners) who all work together to provide you with the care you need, when you need it.  Your next appointment:   To discuss Afib ablation. You will be contacted to schedule.   The format for your next appointment:   In Person  Provider:   Donnice Primus, MD or one of the following Advanced Practice Providers on your designated Care Team:   Charlies Arthur, PA-C Michael Andy Tillery, PA-C Brandy Ollis, NP

## 2024-01-26 ENCOUNTER — Telehealth: Payer: Self-pay | Admitting: Internal Medicine

## 2024-01-26 NOTE — Telephone Encounter (Signed)
 Pt c/o medication issue:  1. Name of Medication: apixaban  (ELIQUIS ) 5 MG TABS tablet   2. How are you currently taking this medication (dosage and times per day)? Take 1 tablet (5 mg total) by mouth 2 (two) times daily.   3. Are you having a reaction (difficulty breathing--STAT)? No  4. What is your medication issue? Patient is calling because she was supposed to start this medication. Patient stated it was going to cost her $300. Patient was informed by the pharmacy that we would potentially have a coupon to get the first month free. Please advise.

## 2024-01-27 ENCOUNTER — Encounter: Payer: Self-pay | Admitting: Internal Medicine

## 2024-01-27 ENCOUNTER — Other Ambulatory Visit (HOSPITAL_COMMUNITY): Payer: Self-pay

## 2024-01-27 MED ORDER — APIXABAN 5 MG PO TABS
5.0000 mg | ORAL_TABLET | Freq: Two times a day (BID) | ORAL | 11 refills | Status: AC
  Filled 2024-01-27 (×2): qty 60, 30d supply, fill #0

## 2024-01-27 NOTE — Telephone Encounter (Signed)
 Patient went to pick up Rx for Eliquis , cost too high.   Sent Rx for new start of Eliquis  to [facility] at [employer]. Patient will plan to pick-up on 02/03/24.  Patient also sent MyChart message asking about Effexor  and Eliquis , this was forwarded to Pharm D team and also forwarded to Rx Med Assistance team re: cost of Eliquis .

## 2024-01-27 NOTE — Telephone Encounter (Signed)
 Pt calling to follow up on coupon for Eliquis  due to cost. Also had questions regarding taking Effexor  along with Eliquis . Please advise.

## 2024-02-01 ENCOUNTER — Other Ambulatory Visit: Payer: Self-pay | Admitting: Internal Medicine

## 2024-02-01 ENCOUNTER — Telehealth (HOSPITAL_COMMUNITY): Payer: Self-pay | Admitting: *Deleted

## 2024-02-01 DIAGNOSIS — R0609 Other forms of dyspnea: Secondary | ICD-10-CM

## 2024-02-01 NOTE — Telephone Encounter (Signed)
 Patient given detailed instructions per Myocardial Perfusion Study Information Sheet for the test on 02/03/24 Patient notified to arrive 15 minutes early and that it is imperative to arrive on time for appointment to keep from having the test rescheduled.  If you need to cancel or reschedule your appointment, please call the office within 24 hours of your appointment. . Patient verbalized understanding. Claudene Ronal Quale

## 2024-02-03 ENCOUNTER — Ambulatory Visit (HOSPITAL_COMMUNITY)
Admission: RE | Admit: 2024-02-03 | Discharge: 2024-02-03 | Disposition: A | Source: Ambulatory Visit | Attending: Internal Medicine | Admitting: Internal Medicine

## 2024-02-03 DIAGNOSIS — R0609 Other forms of dyspnea: Secondary | ICD-10-CM | POA: Diagnosis not present

## 2024-02-03 LAB — MYOCARDIAL PERFUSION IMAGING
LV dias vol: 103 mL (ref 46–106)
LV sys vol: 30 mL (ref 3.8–5.2)
Nuc Stress EF: 71 %
Peak HR: 76 {beats}/min
Rest HR: 61 {beats}/min
Rest Nuclear Isotope Dose: 10.8 mCi
SDS: 0
SRS: 7
SSS: 1
ST Depression (mm): 0 mm
Stress Nuclear Isotope Dose: 31.9 mCi
TID: 0.99

## 2024-02-03 MED ORDER — TECHNETIUM TC 99M TETROFOSMIN IV KIT
10.8000 | PACK | Freq: Once | INTRAVENOUS | Status: AC | PRN
  Administered 2024-02-03: 10.8 via INTRAVENOUS

## 2024-02-03 MED ORDER — TECHNETIUM TC 99M TETROFOSMIN IV KIT
31.9000 | PACK | Freq: Once | INTRAVENOUS | Status: AC | PRN
  Administered 2024-02-03: 31.9 via INTRAVENOUS

## 2024-02-03 MED ORDER — REGADENOSON 0.4 MG/5ML IV SOLN
0.4000 mg | Freq: Once | INTRAVENOUS | Status: AC
  Administered 2024-02-03: 0.4 mg via INTRAVENOUS

## 2024-02-03 MED ORDER — REGADENOSON 0.4 MG/5ML IV SOLN
INTRAVENOUS | Status: AC
  Filled 2024-02-03: qty 5

## 2024-02-07 ENCOUNTER — Ambulatory Visit: Payer: Self-pay | Admitting: Internal Medicine

## 2024-02-09 ENCOUNTER — Telehealth: Payer: Self-pay | Admitting: Internal Medicine

## 2024-02-09 NOTE — Telephone Encounter (Signed)
 Spoke with pt. Pt is wanting next steps. Told her I would speak with Dr Waddell tomorrow in clinic and see what we can make happen for her. Pt stated understanding. Her husband has cancer [place] and she does not want to go until she has a plan.

## 2024-02-09 NOTE — Telephone Encounter (Signed)
Patient called to follow-up on test results. 

## 2024-02-10 ENCOUNTER — Ambulatory Visit: Attending: Internal Medicine

## 2024-02-10 DIAGNOSIS — I480 Paroxysmal atrial fibrillation: Secondary | ICD-10-CM

## 2024-02-10 NOTE — Addendum Note (Signed)
 Addended by: Lorretta Kerce N on: 02/10/2024 05:20 PM   Modules accepted: Orders

## 2024-02-10 NOTE — Progress Notes (Unsigned)
 Enrolled patient for a 14 day Zio XT  monitor to be mailed to patients home

## 2024-02-12 NOTE — Telephone Encounter (Signed)
 Unsure of where note went. Spoke with pt. Confirms zio monitor order would be ok. Checking Afib burden.  Will go from there per GT.

## 2024-03-02 ENCOUNTER — Telehealth: Payer: Self-pay | Admitting: Internal Medicine

## 2024-03-02 NOTE — Telephone Encounter (Signed)
 Spoke with surgeon's office for clarification, Dr. Therisa is requesting Eliquis  to be held.

## 2024-03-02 NOTE — Telephone Encounter (Signed)
 Pharmacy please advise on holding Eliquis  prior to colonoscopy scheduled for 03/11/2023. Thank you.

## 2024-03-02 NOTE — Telephone Encounter (Signed)
"  ° °  Pre-operative Risk Assessment    Patient Name: Brianna French  DOB: [birth year] MRN: [id number]   Date of last office visit: 01/25/24 Date of next office visit: Not yet scheduled   Request for Surgical Clearance    Procedure:  Colonoscopy  Date of Surgery:  Clearance 03/10/24                                Surgeon:  Dr. Therisa Socks Group or Practice Name:  [facility] Phone number:  [phone]  Fax number:  [phone]   Type of Clearance Requested:   - Medical    Type of Anesthesia:  Peripheral   Additional requests/questions:  Caller Randa) noted they will be faxing written clearance request.  Signed, Herma KATHEE Blush   03/02/2024, 8:58 AM   "

## 2024-03-02 NOTE — Telephone Encounter (Signed)
 Patient is on Eliquis . Does Dr.Anna wish this to be held? KL

## 2024-03-04 NOTE — Telephone Encounter (Signed)
 Patient with diagnosis of afib on Eliquis  for anticoagulation.    Procedure: Colonoscopy  Date of procedure: 03/10/24   CHA2DS2-VASc Score = 3   This indicates a 3.2% annual risk of stroke. The patient's score is based upon: CHF History: 0 HTN History: 1 Diabetes History: 0 Stroke History: 0 Vascular Disease History: 0 Age Score: 1 Gender Score: 1      CrCl 105 ml/min Platelet count 258  Patient has not had an Afib/aflutter ablation in the last 3 months, DCCV within the last 4 weeks or a watchman implanted in the last 45 days   Per office protocol, patient can hold Eliquis  for 2 days prior to procedure.    **This guidance is not considered finalized until pre-operative APP has relayed final recommendations.**

## 2024-03-04 NOTE — Telephone Encounter (Signed)
 Dr. Waddell, you recently saw this pt in clinic. Are you able to comment on surgical clearance for upcoming colonoscopy scheduled for 03/10/2024? Please route your response to P CV DIV PREOP. Thank you!

## 2024-03-06 NOTE — Telephone Encounter (Signed)
 Low risk colonoscopy candidate. May proceed.

## 2024-03-07 NOTE — Telephone Encounter (Signed)
"  ° °  Patient Name: Brianna French  DOB: 11/11/1960 MRN: [id number]  Primary Cardiologist: Danelle Birmingham, MD  Chart reviewed as part of pre-operative protocol coverage.   Patient was recently seen in clinic by Dr. Birmingham, per Dr. Birmingham, Low risk colonoscopy candidate. May proceed.  Given past medical history and time since last visit, based on ACC/AHA guidelines, Brianna French is at acceptable risk for the planned procedure without further cardiovascular testing.   Per office protocol, patient can hold Eliquis  for 2 days prior to procedure.  Please resume as soon as safe to do so from a bleeding standpoint.   I will route this recommendation to the requesting party via Epic fax function and remove from pre-op pool.  Please call with questions.  Bella Brummet D Deandre Stansel, NP 03/07/2024, 7:19 AM  "

## 2024-03-08 ENCOUNTER — Telehealth: Payer: Self-pay | Admitting: Internal Medicine

## 2024-03-08 NOTE — Telephone Encounter (Signed)
 Pt has not gotten a call about the results of her Holter monitor. Please advise.

## 2024-03-08 NOTE — Telephone Encounter (Signed)
 Pt aware that monitor findings have not been received yet.  Aware I will forward this to our monitor team to see about expediting getting information downloaded and to Dr. Almetta to read ASAP.   Patient is [place] to return [place] part of the year,  based on what monitor shows and if anything needs to be done prior to leaving.  Pt is very appreciative of any expedited help we can give.

## 2024-03-09 DIAGNOSIS — I480 Paroxysmal atrial fibrillation: Secondary | ICD-10-CM | POA: Diagnosis not present

## 2024-03-10 ENCOUNTER — Ambulatory Visit: Admit: 2024-03-10 | Admitting: Gastroenterology

## 2024-03-10 ENCOUNTER — Ambulatory Visit: Payer: Self-pay | Admitting: Internal Medicine

## 2024-03-10 SURGERY — COLONOSCOPY
Anesthesia: General

## 2024-03-22 ENCOUNTER — Ambulatory Visit
Admission: RE | Admit: 2024-03-22 | Discharge: 2024-03-22 | Disposition: A | Discharge status: Home / Self Care | Source: Ambulatory Visit | Attending: Internal Medicine | Admitting: Internal Medicine

## 2024-03-22 ENCOUNTER — Other Ambulatory Visit: Payer: Self-pay | Admitting: Internal Medicine

## 2024-03-22 DIAGNOSIS — R519 Headache, unspecified: Secondary | ICD-10-CM

## 2024-03-22 DIAGNOSIS — I10 Essential (primary) hypertension: Secondary | ICD-10-CM
